# Patient Record
Sex: Female | Born: 2000 | Race: White | Hispanic: No | Marital: Single | Smoking: Never smoker
Health system: Southern US, Community
[De-identification: ages and names within clinical notes are randomized; demographics above are authoritative.]

## PROBLEM LIST (undated history)

## (undated) DIAGNOSIS — F419 Anxiety disorder, unspecified: Secondary | ICD-10-CM

## (undated) DIAGNOSIS — K219 Gastro-esophageal reflux disease without esophagitis: Secondary | ICD-10-CM

## (undated) DIAGNOSIS — F32A Depression, unspecified: Secondary | ICD-10-CM

---
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
## 2021-01-05 ENCOUNTER — Ambulatory Visit: Payer: BC Managed Care – PPO | Admitting: Family Medicine

## 2021-01-05 NOTE — Progress Notes (Deleted)
Established patient visit   Patient: Tracy Dominguez   DOB: 07-20-2000   20 y.o. Female  MRN: [id number] Visit Date: 01/05/2021  Today's healthcare provider: Wilhemena Durie, MD   No chief complaint on file.  Subjective    HPI  Anxiety, Follow-up  She was last seen for anxiety 6 months ago by Grace Bushy, PA.  Changes made at last visit include discontinuing hydroxyzine. Patient reports that it was ineffective and higher dose made to drowsy. Patient was started on clonazepam.   She reports {excellent/good/fair/poor:19665} compliance with treatment. She reports {good/fair/poor:18685} tolerance of treatment. She {is/is not:21021397} having side effects. {document side effects if present:1}  She feels her anxiety is {Desc; severity:60313} and {improved/worse/unchanged:3041574} since last visit.  Symptoms: {Yes/No:20286} chest pain {Yes/No:20286} difficulty concentrating  {Yes/No:20286} dizziness {Yes/No:20286} fatigue  {Yes/No:20286} feelings of losing control {Yes/No:20286} insomnia  {Yes/No:20286} irritable {Yes/No:20286} palpitations  {Yes/No:20286} panic attacks {Yes/No:20286} racing thoughts  {Yes/No:20286} shortness of breath {Yes/No:20286} sweating  {Yes/No:20286} tremors/shakes    GAD-7 Results GAD-7 Generalized Anxiety Disorder Screening Tool 08/15/2020 03/25/2020 10/08/2019  1. Feeling Nervous, Anxious, or on Edge 3 1 3   2. Not Being Able to Stop or Control Worrying 3 1 3   3. Worrying Too Much About Different Things 3 1 3   4. Trouble Relaxing 2 1 2   5. Being So Restless it's Hard To Sit Still 2 1 1   6. Becoming Easily Annoyed or Irritable 3 1 3   7. Feeling Afraid As If Something Awful Might Happen 3 0 2  Total GAD-7 Score 19 6 17   Difficulty At Work, Home, or Getting  Along With Others? Very difficult Somewhat difficult Very difficult    PHQ-9 Scores PHQ9 SCORE ONLY 08/15/2020 03/25/2020 10/08/2019  PHQ-9 Total Score 16 6 19      ---------------------------------------------------------------------------------------------------   {Show patient history (optional):23778}   Medications: Outpatient Medications Prior to Visit  Medication Sig  . amitriptyline (ELAVIL) 25 MG tablet TAKE 1 TABLET BY MOUTH EVERYDAY AT BEDTIME  . clonazePAM (KLONOPIN) 0.5 MG tablet TAKE 1/2 TO 1 TABLETS (0.25-0.5 MG TOTAL) BY MOUTH 2 (TWO) TIMES DAILY AS NEEDED FOR ANXIETY.  . Dexlansoprazole 30 MG capsule Take 1 capsule (30 mg total) by mouth daily.  Marland Kitchen etonogestrel (NEXPLANON) 68 MG IMPL implant 1 each by Subdermal route once.  . meloxicam (MOBIC) 7.5 MG tablet TAKE 1 TABLET (7.5 MG TOTAL) BY MOUTH DAILY. WITH FOOD  . ondansetron (ZOFRAN-ODT) 4 MG disintegrating tablet Take 4 mg by mouth every 8 (eight) hours as needed.  . pantoprazole (PROTONIX) 20 MG tablet Take 1 tablet (20 mg total) by mouth daily.   No facility-administered medications prior to visit.    Review of Systems  Constitutional:  Negative for activity change and fatigue.  Respiratory:  Negative for cough and shortness of breath.   Cardiovascular:  Negative for chest pain, palpitations and leg swelling.  Neurological:  Negative for dizziness, numbness and headaches.  Psychiatric/Behavioral:  Negative for agitation, self-injury, sleep disturbance and suicidal ideas. The patient is not nervous/anxious.    {Labs  Heme  Chem  Endocrine  Serology  Results Review (optional):23779}   Objective    There were no vitals taken for this visit. {Show previous vital signs (optional):23777}   Physical Exam  ***  No results found for any visits on 01/05/21.  Assessment & Plan     ***  No follow-ups on file.      {provider attestation***:1}   Wilhemena Durie, MD  [facility]  Family Practice [phone] (phone) [phone] (fax)  [place]

## 2021-02-02 ENCOUNTER — Other Ambulatory Visit: Payer: Self-pay | Admitting: Family Medicine

## 2021-02-02 DIAGNOSIS — F411 Generalized anxiety disorder: Secondary | ICD-10-CM

## 2021-02-02 NOTE — Telephone Encounter (Signed)
Requested medication (s) are due for refill today: yes   Requested medication (s) are on the active medication list: yes  Last refill:  01/06/2021  Future visit scheduled:no  Notes to clinic:  this refill cannot be delegated    Requested Prescriptions  Pending Prescriptions Disp Refills   clonazePAM (KLONOPIN) 0.5 MG tablet [Pharmacy Med Name: CLONAZEPAM 0.5 MG TABLET] 60 tablet 1    Sig: TAKE 1/2 TO 1 TABLETS (0.25-0.5 MG TOTAL) BY MOUTH 2 (TWO) TIMES DAILY AS NEEDED FOR ANXIETY.      Not Delegated - Psychiatry:  Anxiolytics/Hypnotics Failed - 02/02/2021 12:38 PM      Failed - This refill cannot be delegated      Passed - Urine Drug Screen completed in last 360 days      Passed - Valid encounter within last 6 months    Recent Outpatient Visits           1 month ago No-show for appointment   [place], MD   5 months ago Acute right-sided low back pain without sciatica   [facility] [place], Clearnce Sorrel, [place]   6 months ago No-show for appointment   [place], [place]   6 months ago Exposure to COVID-19 virus   [place], PA-C   9 months ago Need for meningitis vaccination   [place], Kelby Aline, FNP

## 2021-02-03 ENCOUNTER — Other Ambulatory Visit: Payer: Self-pay | Admitting: Family Medicine

## 2021-02-03 DIAGNOSIS — F411 Generalized anxiety disorder: Secondary | ICD-10-CM

## 2021-02-03 NOTE — Telephone Encounter (Signed)
   Notes to clinic:  Message has been sent to patient to contact office for appt  Refill cannot be delegated by NT  Looks like refused yesterday for appt    Requested Prescriptions  Pending Prescriptions Disp Refills   clonazePAM (KLONOPIN) 0.5 MG tablet [Pharmacy Med Name: CLONAZEPAM 0.5 MG TABLET] 60 tablet 1    Sig: TAKE 1/2 TO 1 TABLETS (0.25-0.5 MG TOTAL) BY MOUTH 2 (TWO) TIMES DAILY AS NEEDED FOR ANXIETY.      Not Delegated - Psychiatry:  Anxiolytics/Hypnotics Failed - 02/03/2021  8:58 AM      Failed - This refill cannot be delegated      Passed - Urine Drug Screen completed in last 360 days      Passed - Valid encounter within last 6 months    Recent Outpatient Visits           1 month ago No-show for appointment   [place], MD   5 months ago Acute right-sided low back pain without sciatica   [facility] [place], Clearnce Sorrel, [place]   6 months ago No-show for appointment   [place], [place]   6 months ago Exposure to COVID-19 virus   [facility], Clearnce Sorrel, PA-C   10 months ago Need for meningitis vaccination   [place], Kelby Aline, FNP

## 2021-03-06 ENCOUNTER — Emergency Department (EMERGENCY_DEPARTMENT_HOSPITAL)
Admission: EM | Admit: 2021-03-06 | Discharge: 2021-03-07 | Disposition: A | Discharge status: Still In Hospital | Payer: BC Managed Care – PPO | Source: Home / Self Care | Attending: Emergency Medicine | Admitting: Emergency Medicine

## 2021-03-06 DIAGNOSIS — F10920 Alcohol use, unspecified with intoxication, uncomplicated: Secondary | ICD-10-CM

## 2021-03-06 DIAGNOSIS — T50902A Poisoning by unspecified drugs, medicaments and biological substances, intentional self-harm, initial encounter: Secondary | ICD-10-CM

## 2021-03-06 DIAGNOSIS — F332 Major depressive disorder, recurrent severe without psychotic features: Secondary | ICD-10-CM

## 2021-03-07 ENCOUNTER — Inpatient Hospital Stay
Admission: RE | Admit: 2021-03-07 | Discharge: 2021-03-11 | DRG: 885 | Disposition: A | Discharge status: Home / Self Care | Payer: BC Managed Care – PPO | Source: Intra-hospital | Attending: Psychiatry | Admitting: Psychiatry

## 2021-03-07 ENCOUNTER — Emergency Department: Payer: BC Managed Care – PPO

## 2021-03-07 ENCOUNTER — Encounter: Payer: Self-pay | Admitting: Emergency Medicine

## 2021-03-07 ENCOUNTER — Other Ambulatory Visit: Payer: Self-pay

## 2021-03-07 ENCOUNTER — Encounter: Payer: Self-pay | Admitting: Psychiatry

## 2021-03-07 DIAGNOSIS — T39312A Poisoning by propionic acid derivatives, intentional self-harm, initial encounter: Secondary | ICD-10-CM | POA: Diagnosis present

## 2021-03-07 DIAGNOSIS — F1220 Cannabis dependence, uncomplicated: Secondary | ICD-10-CM | POA: Diagnosis present

## 2021-03-07 DIAGNOSIS — G8929 Other chronic pain: Secondary | ICD-10-CM | POA: Diagnosis present

## 2021-03-07 DIAGNOSIS — Z20822 Contact with and (suspected) exposure to covid-19: Secondary | ICD-10-CM | POA: Diagnosis present

## 2021-03-07 DIAGNOSIS — Y907 Blood alcohol level of 200-239 mg/100 ml: Secondary | ICD-10-CM | POA: Diagnosis present

## 2021-03-07 DIAGNOSIS — M549 Dorsalgia, unspecified: Secondary | ICD-10-CM | POA: Diagnosis present

## 2021-03-07 DIAGNOSIS — F10220 Alcohol dependence with intoxication, uncomplicated: Secondary | ICD-10-CM | POA: Diagnosis present

## 2021-03-07 DIAGNOSIS — Z79899 Other long term (current) drug therapy: Secondary | ICD-10-CM

## 2021-03-07 DIAGNOSIS — F332 Major depressive disorder, recurrent severe without psychotic features: Secondary | ICD-10-CM

## 2021-03-07 DIAGNOSIS — F411 Generalized anxiety disorder: Secondary | ICD-10-CM | POA: Diagnosis present

## 2021-03-07 DIAGNOSIS — M62838 Other muscle spasm: Secondary | ICD-10-CM | POA: Diagnosis present

## 2021-03-07 DIAGNOSIS — Z818 Family history of other mental and behavioral disorders: Secondary | ICD-10-CM | POA: Diagnosis not present

## 2021-03-07 DIAGNOSIS — F1020 Alcohol dependence, uncomplicated: Secondary | ICD-10-CM | POA: Diagnosis present

## 2021-03-07 DIAGNOSIS — K219 Gastro-esophageal reflux disease without esophagitis: Secondary | ICD-10-CM | POA: Diagnosis present

## 2021-03-07 LAB — SALICYLATE LEVEL
Salicylate Lvl: <7 mg/dL — ABNORMAL LOW (ref 7.0–30.0)
Salicylate Lvl: <7 mg/dL — ABNORMAL LOW (ref 7.0–30.0)

## 2021-03-07 LAB — RESP PANEL BY RT-PCR (FLU A&B, COVID) ARPGX2
Influenza A by PCR: NEGATIVE
Influenza B by PCR: NEGATIVE
SARS Coronavirus 2 by RT PCR: NEGATIVE

## 2021-03-07 LAB — COMPREHENSIVE METABOLIC PANEL
ALT: 16 U/L (ref 0–44)
AST: 26 U/L (ref 15–41)
Albumin: 4.6 g/dL (ref 3.5–5.0)
Alkaline Phosphatase: 61 U/L (ref 38–126)
Anion gap: 6 (ref 5–15)
BUN: <5 mg/dL — ABNORMAL LOW (ref 6–20)
CO2: 29 mmol/L (ref 22–32)
Calcium: 8.5 mg/dL — ABNORMAL LOW (ref 8.9–10.3)
Chloride: 108 mmol/L (ref 98–111)
Creatinine, Ser: 0.64 mg/dL (ref 0.44–1.00)
GFR, Estimated: >60 mL/min (ref >60)
Glucose, Bld: 86 mg/dL (ref 70–99)
Potassium: 3.6 mmol/L (ref 3.5–5.1)
Sodium: 143 mmol/L (ref 135–145)
Total Bilirubin: 0.4 mg/dL (ref 0.3–1.2)
Total Protein: 7.2 g/dL (ref 6.5–8.1)

## 2021-03-07 LAB — URINE DRUG SCREEN, QUALITATIVE (ARMC ONLY)
Amphetamines, Ur Screen: NOT DETECTED
Barbiturates, Ur Screen: NOT DETECTED
Benzodiazepine, Ur Scrn: NOT DETECTED
Cannabinoid 50 Ng, Ur ~~LOC~~: POSITIVE — AB
Cocaine Metabolite,Ur ~~LOC~~: NOT DETECTED
MDMA (Ecstasy)Ur Screen: NOT DETECTED
Methadone Scn, Ur: NOT DETECTED
Opiate, Ur Screen: NOT DETECTED
Phencyclidine (PCP) Ur S: NOT DETECTED
Tricyclic, Ur Screen: NOT DETECTED

## 2021-03-07 LAB — CBC WITH DIFFERENTIAL/PLATELET
Abs Immature Granulocytes: 0.02 10*3/uL (ref 0.00–0.07)
Basophils Absolute: 0 10*3/uL (ref 0.0–0.1)
Basophils Relative: 0 %
Eosinophils Absolute: 0.1 10*3/uL (ref 0.0–0.5)
Eosinophils Relative: 1 %
HCT: 44.2 % (ref 36.0–46.0)
Hemoglobin: 15.1 g/dL — ABNORMAL HIGH (ref 12.0–15.0)
Immature Granulocytes: 0 %
Lymphocytes Relative: 55 %
Lymphs Abs: 2.7 10*3/uL (ref 0.7–4.0)
MCH: 33.3 pg (ref 26.0–34.0)
MCHC: 34.2 g/dL (ref 30.0–36.0)
MCV: 97.6 fL (ref 80.0–100.0)
Monocytes Absolute: 0.3 10*3/uL (ref 0.1–1.0)
Monocytes Relative: 7 %
Neutro Abs: 1.8 10*3/uL (ref 1.7–7.7)
Neutrophils Relative %: 37 %
Platelets: 252 10*3/uL (ref 150–400)
RBC: 4.53 MIL/uL (ref 3.87–5.11)
RDW: 12.3 % (ref 11.5–15.5)
WBC: 4.9 10*3/uL (ref 4.0–10.5)
nRBC: 0 % (ref 0.0–0.2)

## 2021-03-07 LAB — URINALYSIS, COMPLETE (UACMP) WITH MICROSCOPIC
Bilirubin Urine: NEGATIVE
Glucose, UA: NEGATIVE mg/dL
Ketones, ur: NEGATIVE mg/dL
Leukocytes,Ua: NEGATIVE
Nitrite: NEGATIVE
Protein, ur: NEGATIVE mg/dL
Specific Gravity, Urine: 1.013 (ref 1.005–1.030)
pH: 7 (ref 5.0–8.0)

## 2021-03-07 LAB — ETHANOL: Alcohol, Ethyl (B): 239 mg/dL — ABNORMAL HIGH (ref <10)

## 2021-03-07 LAB — ACETAMINOPHEN LEVEL
Acetaminophen (Tylenol), Serum: <10 ug/mL — ABNORMAL LOW (ref 10–30)
Acetaminophen (Tylenol), Serum: <10 ug/mL — ABNORMAL LOW (ref 10–30)

## 2021-03-07 LAB — POC URINE PREG, ED
Preg Test, Ur: NEGATIVE
Preg Test, Ur: NEGATIVE

## 2021-03-07 LAB — LIPASE, BLOOD: Lipase: 23 U/L (ref 11–51)

## 2021-03-07 LAB — TROPONIN I (HIGH SENSITIVITY)
Troponin I (High Sensitivity): <2 ng/L (ref <18)
Troponin I (High Sensitivity): <2 ng/L (ref <18)

## 2021-03-07 MED ORDER — CLONAZEPAM 0.5 MG PO TABS
0.5000 mg | ORAL_TABLET | Freq: Every day | ORAL | Status: DC | PRN
  Administered 2021-03-07 – 2021-03-08 (×3): 0.5 mg via ORAL
  Filled 2021-03-07 (×3): qty 1

## 2021-03-07 MED ORDER — ACETAMINOPHEN 325 MG PO TABS
650.0000 mg | ORAL_TABLET | Freq: Four times a day (QID) | ORAL | Status: DC | PRN
  Administered 2021-03-07: 650 mg via ORAL
  Filled 2021-03-07: qty 2

## 2021-03-07 MED ORDER — MAGNESIUM HYDROXIDE 400 MG/5ML PO SUSP: 30.0000 mL | Freq: Every day | ORAL | Status: DC | PRN

## 2021-03-07 MED ORDER — TRAZODONE HCL 50 MG PO TABS
50.0000 mg | ORAL_TABLET | Freq: Every evening | ORAL | Status: DC | PRN
  Administered 2021-03-08 – 2021-03-10 (×3): 50 mg via ORAL
  Filled 2021-03-07 (×3): qty 1

## 2021-03-07 MED ORDER — FAMOTIDINE IN NACL 20-0.9 MG/50ML-% IV SOLN
20.0000 mg | Freq: Once | INTRAVENOUS | Status: AC
  Administered 2021-03-07: 20 mg via INTRAVENOUS
  Filled 2021-03-07: qty 50

## 2021-03-07 MED ORDER — PANTOPRAZOLE SODIUM 40 MG PO TBEC
40.0000 mg | DELAYED_RELEASE_TABLET | Freq: Every day | ORAL | Status: DC
  Administered 2021-03-08 – 2021-03-11 (×4): 40 mg via ORAL
  Filled 2021-03-07 (×4): qty 1

## 2021-03-07 MED ORDER — PANTOPRAZOLE SODIUM 40 MG PO TBEC
40.0000 mg | DELAYED_RELEASE_TABLET | Freq: Every day | ORAL | Status: DC
  Filled 2021-03-07: qty 1

## 2021-03-07 MED ORDER — TRAZODONE HCL 50 MG PO TABS
50.0000 mg | ORAL_TABLET | Freq: Every day | ORAL | Status: DC
  Administered 2021-03-07: 50 mg via ORAL
  Filled 2021-03-07: qty 1

## 2021-03-07 MED ORDER — CLONAZEPAM 0.5 MG PO TABS: 0.5000 mg | ORAL_TABLET | Freq: Two times a day (BID) | ORAL | Status: DC | PRN

## 2021-03-07 MED ORDER — SODIUM CHLORIDE 0.9 % IV BOLUS (SEPSIS)
1000.0000 mL | Freq: Once | INTRAVENOUS | Status: AC
  Administered 2021-03-07: 1000 mL via INTRAVENOUS

## 2021-03-07 MED ORDER — SODIUM CHLORIDE 0.9 % IV BOLUS: 1000.0000 mL | Freq: Once | INTRAVENOUS | Status: DC

## 2021-03-07 MED ORDER — ONDANSETRON HCL 4 MG/2ML IJ SOLN
4.0000 mg | Freq: Once | INTRAMUSCULAR | Status: AC
  Administered 2021-03-07: 4 mg via INTRAVENOUS
  Filled 2021-03-07: qty 2

## 2021-03-07 MED ORDER — CHARCOAL ACTIVATED PO LIQD
50.0000 g | Freq: Once | ORAL | Status: AC
  Administered 2021-03-07: 50 g via ORAL
  Filled 2021-03-07: qty 240

## 2021-03-07 MED ORDER — ALUM & MAG HYDROXIDE-SIMETH 200-200-20 MG/5ML PO SUSP: 30.0000 mL | ORAL | Status: DC | PRN

## 2021-03-07 MED ORDER — CLONAZEPAM 0.5 MG PO TABS
0.5000 mg | ORAL_TABLET | Freq: Every day | ORAL | Status: DC | PRN
  Administered 2021-03-07: 0.5 mg via ORAL
  Filled 2021-03-07: qty 1

## 2021-03-07 MED ORDER — AMITRIPTYLINE HCL 50 MG PO TABS: 25.0000 mg | ORAL_TABLET | Freq: Every day | ORAL | Status: DC

## 2021-03-07 NOTE — BH Assessment (Signed)
Comprehensive Clinical Assessment (CCA) Note  03/07/2021 Tracy Dominguez [id number]  Chief Complaint:  Chief Complaint  Patient presents with   Drug Overdose   Visit Diagnosis: Major Depression   Tracy Dominguez is an 20 year old female who presents to the ER due to taking an intentional overdose of medications. She states she was drinking alcohol and started to feel sad. It resulted in her ingesting the medications. Upon arrival to the ER her BAC was 239. She denies SI but when discussing what happened, she has poor insight into what done and minimized the actions.  During the interview the patient was calm, cooperative and pleasant. She was able to provide appropriate answers to the questions. She denies SI/HI and AV/H.  CCA Screening, Triage and Referral (STR)  Patient Reported Information How did you hear about [place]? Self  What Is the Reason for Your Visit/Call Today? Patient took an intentional overdose of medications to end her life.  How Long Has This Been Causing You Problems? <Week  What Do You Feel Would Help You the Most Today? Treatment for Depression or other mood problem   Have You Recently Had Any Thoughts About Hurting Yourself? Yes  Are You Planning to Commit Suicide/Harm Yourself At This time? Yes   Have you Recently Had Thoughts About Hurting Someone [place] Dawn? No  Are You Planning to Harm Someone at This Time? No  Explanation: No data recorded  Have You Used Any Alcohol or Drugs in the Past 24 Hours? Yes  How Long Ago Did You Use Drugs or Alcohol? No data recorded What Did You Use and How Much? Alcohol and cannabis   Do You Currently Have a Therapist/Psychiatrist? No  Name of Therapist/Psychiatrist: No data recorded  Have You Been Recently Discharged From Any Office Practice or Programs? No  Explanation of Discharge From Practice/Program: No data recorded    CCA Screening Triage Referral Assessment Type of Contact: Face-to-Face  Telemedicine Service  Delivery:   Is this Initial or Reassessment? No data recorded Date Telepsych consult ordered in [facility]:  No data recorded Time Telepsych consult ordered in [facility]:  No data recorded Location of Assessment: [facility] ED  Provider Location: [facility] ED   Collateral Involvement: No data recorded  Does Patient Have a [place]? No data recorded Name and Contact of Legal Guardian: No data recorded If Minor and Not Living with Parent(s), Who has Custody? No data recorded Is CPS involved or ever been involved? Never  Is APS involved or ever been involved? Never   Patient Determined To Be At Risk for Harm To Self or Others Based on Review of Patient Reported Information or Presenting Complaint? Yes, for Self-Harm  Method: No data recorded Availability of Means: No data recorded Intent: No data recorded Notification Required: No data recorded Additional Information for Danger to Others Potential: No data recorded Additional Comments for Danger to Others Potential: No data recorded Are There Guns or Other Weapons in Your Home? No data recorded Types of Guns/Weapons: No data recorded Are These Weapons Safely Secured?                            No data recorded Who Could Verify You Are Able To Have These Secured: No data recorded Do You Have any Outstanding Charges, Pending Court Dates, Parole/Probation? No data recorded Contacted To Inform of Risk of Harm To Self or Others: No data recorded   Does Patient Present under Involuntary Commitment? Yes  IVC Papers Initial File Date: 03/07/21   [place] of Residence: [place]   Patient Currently Receiving the Following Services: Not Receiving Services   Determination of Need: Emergent (2 hours)   Options For Referral: Inpatient Hospitalization     CCA Biopsychosocial Patient Reported Schizophrenia/Schizoaffective Diagnosis in Past: No   Strengths: Have a support system, some insight into her problems and wanting  help.   Mental Health Symptoms Depression:   Hopelessness; Worthlessness   Duration of Depressive symptoms:  Duration of Depressive Symptoms: Less than two weeks   Mania:   None   Anxiety:    None   Psychosis:   None   Duration of Psychotic symptoms:    Trauma:   None   Obsessions:   None   Compulsions:   None   Inattention:   None   Hyperactivity/Impulsivity:   None   Oppositional/Defiant Behaviors:   None   Emotional Irregularity:   N/A   Other Mood/Personality Symptoms:  No data recorded   Mental Status Exam Appearance and self-care  Stature:   Average   Weight:   Average weight   Clothing:   Age-appropriate; Neat/clean   Grooming:   Normal   Cosmetic use:   None   Posture/gait:   Normal   Motor activity:   -- (Within normal range)   Sensorium  Attention:   Normal   Concentration:   Normal   Orientation:   X5   Recall/memory:   Normal   Affect and Mood  Affect:   Appropriate; Full Range   Mood:   Anxious; Depressed   Relating  Eye contact:   Normal   Facial expression:   Depressed; Anxious   Attitude toward examiner:   Cooperative   Thought and Language  Speech flow:  Clear and Coherent; Normal   Thought content:   Appropriate to Mood and Circumstances   Preoccupation:   None   Hallucinations:   None   Organization:  No data recorded  [employer] of Knowledge:   Fair   Intelligence:   Average   Abstraction:   Functional   Judgement:   Impaired   Reality Testing:   Adequate   Insight:   Poor   Decision Making:   Impulsive   Social Functioning  Social Maturity:   Impulsive   Social Judgement:   Normal   Stress  Stressors:   Other (Comment)   Coping Ability:   Overwhelmed   Skill Deficits:   None   Supports:   Family; Friends/Service system     Religion: Religion/Spirituality Are You A Religious Person?: No  Leisure/Recreation: Leisure /  Recreation Do You Have Hobbies?: No  Exercise/Diet: Exercise/Diet Do You Exercise?: No Have You Gained or Lost A Significant Amount of Weight in the Past Six Months?: No Do You Follow a Special Diet?: No Do You Have Any Trouble Sleeping?: No   CCA Employment/Education Employment/Work Situation: Employment / Work Situation Has Patient ever Been in Passenger transport manager?: No  Education: Education Last Grade Completed: 12 Did You Nutritional therapist?: No Did You Have An Individualized Education Program (IIEP): No Did You Have Any Difficulty At [employer]?: No Patient's Education Has Been Impacted by Current Illness: No   CCA Family/Childhood History Family and Relationship History: Family history Marital status: Long term relationship Does patient have children?: No  Childhood History:  Childhood History By whom was/is the patient raised?: Both parents, Mother Did patient suffer any verbal/emotional/physical/sexual abuse as a child?: No Did patient  suffer from severe childhood neglect?: No Has patient ever been sexually abused/assaulted/raped as an adolescent or adult?: No Was the patient ever a victim of a crime or a disaster?: No Witnessed domestic violence?: No Has patient been affected by domestic violence as an adult?: No  Child/Adolescent Assessment:     CCA Substance Use Alcohol/Drug Use: Alcohol / Drug Use Pain Medications: See PTA Prescriptions: See PTA Over the Counter: See PTA History of alcohol / drug use?: Yes Longest period of sobriety (when/how long): Unable to quantify Substance #1 Name of Substance 1: Alcohol 1 - Last Use / Amount: 03/06/2021 Substance #2 Name of Substance 2: Cananbis 2 - Last Use / Amount: Unknown   ASAM's:  Six Dimensions of Multidimensional Assessment  Dimension 1:  Acute Intoxication and/or Withdrawal Potential:      Dimension 2:  Biomedical Conditions and Complications:      Dimension 3:  Emotional, Behavioral, or Cognitive  Conditions and Complications:     Dimension 4:  Readiness to Change:     Dimension 5:  Relapse, Continued use, or Continued Problem Potential:     Dimension 6:  Recovery/Living Environment:     ASAM Severity Score:    ASAM Recommended Level of Treatment:     Substance use Disorder (SUD)    Recommendations for Services/Supports/Treatments:    Discharge Disposition:    DSM5 Diagnoses: Patient Active Problem List   Diagnosis Date Noted   Major depressive disorder, recurrent severe without psychotic features ([place]) 03/07/2021   Difficulty sleeping 03/25/2020   Gastroesophageal reflux disease without esophagitis 03/25/2020    Referrals to Alternative Service(s): Referred to Alternative Service(s):   Place:   Date:   Time:    Referred to Alternative Service(s):   Place:   Date:   Time:    Referred to Alternative Service(s):   Place:   Date:   Time:    Referred to Alternative Service(s):   Place:   Date:   Time:     Gunnar Fusi MS, LCAS, [facility], [facility] Therapeutic Triage Specialist 03/07/2021 1:41 PM

## 2021-03-07 NOTE — Consult Note (Signed)
[facility]iatry Consult   Reason for Consult:  intentional overdose Referring Physician:  EDP Patient Identification: Tracy Dominguez MRN:  [id number] Principal Diagnosis: Major depressive disorder, recurrent severe without psychotic features ([place]) Diagnosis:  Principal Problem:   Major depressive disorder, recurrent severe without psychotic features ([place])   Total Time spent with patient: 1 hour  Subjective:   Tracy Dominguez is a 20 y.o. female patient admitted with intentional overdose.  HPI:  20 yo female with an overdose of 30-35 ibuprofen yesterday after drinking 3 drinks.  She denies this was a suicide attempt, yet acknowledges she "took too much ibuprofen.  I got drunk and really, really sad."  Minimizes her symptoms, appears depressed with decreased activity.  Denies prior attempts, history of anxiety and sleep issues.  Brother with bipolar disorder and mother with depression and anxiety.  She graduated from high school this spring.  Past Psychiatric History: depression, anxiety  Risk to Self:  yes Risk to Others:  none Prior Inpatient Therapy:  denies Prior Outpatient Therapy:  none  Past Medical History:  Past Medical History:  Diagnosis Date   Anxiety    Depression    GERD (gastroesophageal reflux disease)    History reviewed. No pertinent surgical history. Family History:  Family History  Problem Relation Age of Onset   Depression Mother    Anxiety disorder Mother    Obesity Mother    Bipolar disorder Brother    Liver disease Maternal Grandmother    Kidney disease Maternal Grandmother    Breast cancer Maternal Grandmother    Kidney disease Paternal Grandmother    Hypertension Paternal Biochemist, clinical    Family Psychiatric  History: see above Social History:  Social History   Substance and Sexual Activity  Alcohol Use Yes   Comment: occ     Social History   Substance and Sexual Activity  Drug Use Never    Social History    Socioeconomic History   Marital status: Single    Spouse name: Not on file   Number of children: Not on file   Years of education: Not on file   Highest education level: Not on file  Occupational History   Occupation: student  Tobacco Use   Smoking status: Never   Smokeless tobacco: Never  Vaping Use   Vaping Use: Every day  Substance and Sexual Activity   Alcohol use: Yes    Comment: occ   Drug use: Never   Sexual activity: Yes    Birth control/protection: Implant  Other Topics Concern   Not on file  Social History Narrative   Not on file   Social Determinants of Health   Financial Resource Strain: Not on file  Food Insecurity: Not on file  Transportation Needs: Not on file  Physical Activity: Not on file  Stress: Not on file  Social Connections: Not on file   Additional Social History:    Allergies:  No Known Allergies  Labs:  Results for orders placed or performed during the hospital encounter of 03/06/21 (from the past 48 hour(s))  CBC with Differential     Status: Abnormal   Collection Time: 03/07/21 12:09 AM  Result Value Ref Range   WBC 4.9 4.0 - 10.5 K/uL   RBC 4.53 3.87 - 5.11 MIL/uL   Hemoglobin 15.1 (H) 12.0 - 15.0 g/dL   HCT 44.2 36.0 - 46.0 %   MCV 97.6 80.0 - 100.0 fL   MCH 33.3 26.0 - 34.0 pg  MCHC 34.2 30.0 - 36.0 g/dL   RDW 12.3 11.5 - 15.5 %   Platelets 252 150 - 400 K/uL   nRBC 0.0 0.0 - 0.2 %   Neutrophils Relative % 37 %   Neutro Abs 1.8 1.7 - 7.7 K/uL   Lymphocytes Relative 55 %   Lymphs Abs 2.7 0.7 - 4.0 K/uL   Monocytes Relative 7 %   Monocytes Absolute 0.3 0.1 - 1.0 K/uL   Eosinophils Relative 1 %   Eosinophils Absolute 0.1 0.0 - 0.5 K/uL   Basophils Relative 0 %   Basophils Absolute 0.0 0.0 - 0.1 K/uL   Immature Granulocytes 0 %   Abs Immature Granulocytes 0.02 0.00 - 0.07 K/uL    Comment: Performed at [facility], [address] [place], [place] [address]  Comprehensive metabolic panel     Status: Abnormal    Collection Time: 03/07/21 12:09 AM  Result Value Ref Range   Sodium 143 135 - 145 mmol/L   Potassium 3.6 3.5 - 5.1 mmol/L   Chloride 108 98 - 111 mmol/L   CO2 29 22 - 32 mmol/L   Glucose, Bld 86 70 - 99 mg/dL    Comment: Glucose reference range applies only to samples taken after fasting for at least 8 hours.   BUN <5 (L) 6 - 20 mg/dL   Creatinine, Ser 0.64 0.44 - 1.00 mg/dL   Calcium 8.5 (L) 8.9 - 10.3 mg/dL   Total Protein 7.2 6.5 - 8.1 g/dL   Albumin 4.6 3.5 - 5.0 g/dL   AST 26 15 - 41 U/L   ALT 16 0 - 44 U/L   Alkaline Phosphatase 61 38 - 126 U/L   Total Bilirubin 0.4 0.3 - 1.2 mg/dL   GFR, Estimated >60 >60 mL/min    Comment: (NOTE) Calculated using the CKD-EPI Creatinine Equation (2021)    Anion gap 6 5 - 15    Comment: Performed at [facility] [address] [place], [place] [address]  Ethanol     Status: Abnormal   Collection Time: 03/07/21 12:09 AM  Result Value Ref Range   Alcohol, Ethyl (B) 239 (H) <10 mg/dL    Comment: (NOTE) Lowest detectable limit for serum alcohol is 10 mg/dL.  For medical purposes only. Performed at [facility], [place], [place] [address]   Acetaminophen level     Status: Abnormal   Collection Time: 03/07/21 12:09 AM  Result Value Ref Range   Acetaminophen (Tylenol), Serum <10 (L) 10 - 30 ug/mL    Comment: (NOTE) Therapeutic concentrations vary significantly. A range of 10-30 ug/mL  may be an effective concentration for many patients. However, some  are best treated at concentrations outside of this range. Acetaminophen concentrations >150 ug/mL at 4 hours after ingestion  and >50 ug/mL at 12 hours after ingestion are often associated with  toxic reactions.  Performed at [facility], [address] [place], [place] [id number]   Salicylate level     Status: Abnormal   Collection Time: 03/07/21 12:09 AM  Result Value Ref Range   Salicylate Lvl Q000111Q (L) 7.0 - 30.0 mg/dL    Comment:  Performed at [facility], [address] [place], [place] [address]  Lipase, blood     Status: None   Collection Time: 03/07/21 12:09 AM  Result Value Ref Range   Lipase 23 11 - 51 U/L    Comment: Performed at [facility], [address],  [place], [place] [address]  Troponin I (High Sensitivity)     Status: None   Collection Time: 03/07/21 12:09 AM  Result Value Ref Range   Troponin I (High Sensitivity) <2 <18 ng/L    Comment: (NOTE) Elevated high sensitivity troponin I (hsTnI) values and significant  changes across serial measurements may suggest ACS but many other  chronic and acute conditions are known to elevate hsTnI results.  Refer to the "Links" section for chest pain algorithms and additional  guidance. Performed at [facility], [address] [place], [place] [address]   Urinalysis, Complete w Microscopic     Status: Abnormal   Collection Time: 03/07/21 12:09 AM  Result Value Ref Range   Color, Urine YELLOW (A) YELLOW   APPearance CLEAR (A) CLEAR   Specific Gravity, Urine 1.013 1.005 - 1.030   pH 7.0 5.0 - 8.0   Glucose, UA NEGATIVE NEGATIVE mg/dL   Hgb urine dipstick LARGE (A) NEGATIVE   Bilirubin Urine NEGATIVE NEGATIVE   Ketones, ur NEGATIVE NEGATIVE mg/dL   Protein, ur NEGATIVE NEGATIVE mg/dL   Nitrite NEGATIVE NEGATIVE   Leukocytes,Ua NEGATIVE NEGATIVE   RBC / HPF 0-5 0 - 5 RBC/hpf   WBC, UA 0-5 0 - 5 WBC/hpf   Bacteria, UA RARE (A) NONE SEEN   Squamous Epithelial / LPF 0-5 0 - 5   Mucus PRESENT     Comment: Performed at [facility], [address], [place], [place] [address]  Urine Drug Screen, Qualitative     Status: Abnormal   Collection Time: 03/07/21 12:09 AM  Result Value Ref Range   Tricyclic, Ur Screen NONE DETECTED NONE DETECTED   Amphetamines, Ur Screen NONE DETECTED NONE DETECTED   MDMA (Ecstasy)Ur Screen NONE DETECTED NONE DETECTED   Cocaine Metabolite,Ur [place] NONE DETECTED NONE DETECTED   Opiate, Ur  Screen NONE DETECTED NONE DETECTED   Phencyclidine (PCP) Ur S NONE DETECTED NONE DETECTED   Cannabinoid 50 Ng, Ur [place] POSITIVE (A) NONE DETECTED   Barbiturates, Ur Screen NONE DETECTED NONE DETECTED   Benzodiazepine, Ur Scrn NONE DETECTED NONE DETECTED   Methadone Scn, Ur NONE DETECTED NONE DETECTED    Comment: (NOTE) Tricyclics + metabolites, urine    Cutoff 1000 ng/mL Amphetamines + metabolites, urine  Cutoff 1000 ng/mL MDMA (Ecstasy), urine              Cutoff 500 ng/mL Cocaine Metabolite, urine          Cutoff 300 ng/mL Opiate + metabolites, urine        Cutoff 300 ng/mL Phencyclidine (PCP), urine         Cutoff 25 ng/mL Cannabinoid, urine                 Cutoff 50 ng/mL Barbiturates + metabolites, urine  Cutoff 200 ng/mL Benzodiazepine, urine              Cutoff 200 ng/mL Methadone, urine                   Cutoff 300 ng/mL  The urine drug screen provides only a preliminary, unconfirmed analytical test result and should not be used for non-medical purposes. Clinical consideration and professional judgment should be applied to any positive drug screen result due to possible interfering substances. A more specific alternate chemical method must be used in order to obtain a confirmed analytical result. Gas chromatography / mass spectrometry (GC/MS) is the preferred confirm atory method. Performed at [facility], [phone] [place], [place] [address]   POC urine preg, ED     Status: None   Collection Time: 03/07/21 12:15 AM  Result Value Ref Range   Preg Test, Ur NEGATIVE NEGATIVE    Comment:        THE SENSITIVITY OF THIS METHODOLOGY IS >24 mIU/mL   Troponin I (High Sensitivity)     Status: None   Collection Time: 03/07/21  2:33 AM  Result Value Ref Range   Troponin I (High Sensitivity) <2 <18 ng/L    Comment: (NOTE) Elevated high sensitivity troponin I (hsTnI) values and significant  changes across serial measurements may suggest ACS but many other   chronic and acute conditions are known to elevate hsTnI results.  Refer to the "Links" section for chest pain algorithms and additional  guidance. Performed at [facility], [address] [place], [place] [address]   Acetaminophen level     Status: Abnormal   Collection Time: 03/07/21  4:29 AM  Result Value Ref Range   Acetaminophen (Tylenol), Serum <10 (L) 10 - 30 ug/mL    Comment: (NOTE) Therapeutic concentrations vary significantly. A range of 10-30 ug/mL  may be an effective concentration for many patients. However, some  are best treated at concentrations outside of this range. Acetaminophen concentrations >150 ug/mL at 4 hours after ingestion  and >50 ug/mL at 12 hours after ingestion are often associated with  toxic reactions.  Performed at [facility], [address] [place], [place] [id number]   Salicylate level     Status: Abnormal   Collection Time: 03/07/21  4:29 AM  Result Value Ref Range   Salicylate Lvl Q000111Q (L) 7.0 - 30.0 mg/dL    Comment: Performed at [facility], [address] [place], [place] [address]  POC urine preg, ED     Status: None   Collection Time: 03/07/21  5:47 AM  Result Value Ref Range   Preg Test, Ur Negative Negative    Current Facility-Administered Medications  Medication Dose Route Frequency Provider Last Rate Last Admin   clonazePAM (KLONOPIN) tablet 0.5 mg  0.5 mg Oral Daily PRN Patrecia Pour, NP       pantoprazole (PROTONIX) EC tablet 40 mg  40 mg Oral Daily Ward, Kristen N, DO       Current Outpatient Medications  Medication Sig Dispense Refill   amitriptyline (ELAVIL) 25 MG tablet TAKE 1 TABLET BY MOUTH EVERYDAY AT BEDTIME 90 tablet 1   clonazePAM (KLONOPIN) 0.5 MG tablet TAKE 1/2 TO 1 TABLETS (0.25-0.5 MG TOTAL) BY MOUTH 2 (TWO) TIMES DAILY AS NEEDED FOR ANXIETY. 60 tablet 0   Dexlansoprazole 30 MG capsule Take 1 capsule (30 mg total) by mouth daily. 30 capsule 3   etonogestrel (NEXPLANON) 68  MG IMPL implant 1 each by Subdermal route once.     meloxicam (MOBIC) 7.5 MG tablet TAKE 1 TABLET (7.5 MG TOTAL) BY MOUTH DAILY. WITH FOOD 30 tablet 0   ondansetron (ZOFRAN-ODT) 4 MG disintegrating tablet Take 4 mg by mouth every 8 (eight) hours as needed.     pantoprazole (PROTONIX) 20 MG tablet Take 1 tablet (20 mg total) by mouth daily. 30 tablet 1    Musculoskeletal: Strength & Muscle Tone: within normal limits Gait & Station: normal Patient leans: N/A  Psychiatric Specialty Exam: Physical Exam Vitals and nursing note reviewed.  Constitutional:      Appearance: Normal appearance.  HENT:     Head: Normocephalic.  Nose: Nose normal.  Pulmonary:     Effort: Pulmonary effort is normal.  Musculoskeletal:        General: Normal range of motion.     Cervical back: Normal range of motion.  Neurological:     General: No focal deficit present.     Mental Status: She is alert and oriented to person, place, and time.  Psychiatric:        Attention and Perception: Attention and perception normal.        Mood and Affect: Mood is anxious and depressed.        Speech: Speech normal.        Behavior: Behavior normal. Behavior is cooperative.        Thought Content: Thought content includes suicidal ideation. Thought content includes suicidal plan.        Cognition and Memory: Cognition and memory normal.        Judgment: Judgment is impulsive.    Review of Systems  Psychiatric/Behavioral:  Positive for depression and suicidal ideas. The patient is nervous/anxious.   All other systems reviewed and are negative.  Blood pressure 103/71, pulse 78, temperature 97.9 F (36.6 C), temperature source Oral, resp. rate 12, height '5\' 4"'$  (1.626 m), weight 58.1 kg, SpO2 98 %.Body mass index is 21.97 kg/m.  General Appearance: Disheveled  Eye Contact:  Fair  Speech:  Normal Rate  Volume:  Decreased  Mood:  Anxious and Depressed  Affect:  Congruent  Thought Process:  Coherent and Descriptions  of Associations: Intact  Orientation:  Full (Time, Place, and Person)  Thought Content:  WDL and Logical  Suicidal Thoughts:  Yes.  with intent/plan  Homicidal Thoughts:  No  Memory:  Immediate;   Fair Recent;   Fair Remote;   Fair  Judgement:  Poor  Insight:  Lacking  Psychomotor Activity:  Decreased  Concentration:  Concentration: Fair and Attention Span: Fair  Recall:  [employer] of Knowledge:  Fair  Language:  Good  Akathisia:  No  Handed:  Right  AIMS (if indicated):     Assets:  Housing Leisure Time Physical Health Resilience Social Support  ADL's:  Intact  Cognition:  WNL  Sleep:        Physical Exam: Physical Exam Vitals and nursing note reviewed.  Constitutional:      Appearance: Normal appearance.  HENT:     Head: Normocephalic.     Nose: Nose normal.  Pulmonary:     Effort: Pulmonary effort is normal.  Musculoskeletal:        General: Normal range of motion.     Cervical back: Normal range of motion.  Neurological:     General: No focal deficit present.     Mental Status: She is alert and oriented to person, place, and time.  Psychiatric:        Attention and Perception: Attention and perception normal.        Mood and Affect: Mood is anxious and depressed.        Speech: Speech normal.        Behavior: Behavior normal. Behavior is cooperative.        Thought Content: Thought content includes suicidal ideation. Thought content includes suicidal plan.        Cognition and Memory: Cognition and memory normal.        Judgment: Judgment is impulsive.   Review of Systems  Psychiatric/Behavioral:  Positive for depression and suicidal ideas. The patient is nervous/anxious.   All  other systems reviewed and are negative. Blood pressure 103/71, pulse 78, temperature 97.9 F (36.6 C), temperature source Oral, resp. rate 12, height '5\' 4"'$  (1.626 m), weight 58.1 kg, SpO2 98 %. Body mass index is 21.97 kg/m.  Treatment Plan Summary: Daily contact with patient  to assess and evaluate symptoms and progress in treatment, Medication management, and Plan : Major depressive disorder, recurrent, severe without psychosis: -Admit to inpatient psychiatric facility for stabilization  Anxiety: -Decreased Klonopin 0.5 mg BID PRN to daily PRN  Disposition: Recommend psychiatric Inpatient admission when medically cleared.  Waylan Boga, NP 03/07/2021 12:28 PM

## 2021-03-07 NOTE — ED Notes (Signed)
IVC/pending psych consult 

## 2021-03-07 NOTE — ED Notes (Signed)
Mom at bedside.

## 2021-03-07 NOTE — ED Notes (Signed)
Psych NP and TTS at bedside 

## 2021-03-07 NOTE — ED Provider Notes (Signed)
Emergency Medicine Observation Re-evaluation Note  Tracy Dominguez is a 20 y.o. female, seen on rounds today.  Pt initially presented to the ED for complaints of Drug Overdose Currently, the patient is resting comfortably without complaints.  Physical Exam  BP (!) 82/57   Pulse 76   Temp 97.9 F (36.6 C) (Oral)   Resp 16   Ht '5\' 4"'$  (1.626 m)   Wt 58.1 kg   SpO2 98%   BMI 21.97 kg/m  Physical Exam Gen: No acute distress  Resp: Normal rise and fall of chest Neuro: Moving all four extremities Psych: Resting currently, calm and cooperative when awake    ED Course / MDM  EKG:   I have reviewed the labs performed to date as well as medications administered while in observation.  Recent changes in the last 24 hours include no acute events overnight.  Plan  Current plan is for psychiatric evaluation for further disposition. Tracy Dominguez is under involuntary commitment.   Patient will intermittently have low blood pressures documented but has normal heart rate.  She is lying on the side where her blood pressure cuff is.  When we wake her up and check it again it improves into the upper 90s.  She denies any dizziness, chest pain or shortness of breath.  No further vomiting.  Tolerating p.o.   Maryse Brierley, Delice Bison, DO 03/07/21 [phone]

## 2021-03-07 NOTE — Plan of Care (Signed)
Patient new to the unit tonight, hasn't had time to progress  Problem: Education: Goal: Knowledge of [facility] Education information/materials will improve Outcome: Not Progressing Goal: Emotional status will improve Outcome: Not Progressing Goal: Mental status will improve Outcome: Not Progressing Goal: Verbalization of understanding the information provided will improve Outcome: Not Progressing   Problem: Safety: Goal: Periods of time without injury will increase Outcome: Not Progressing   Problem: Education: Goal: Utilization of techniques to improve thought processes will improve Outcome: Not Progressing Goal: Knowledge of the prescribed therapeutic regimen will improve Outcome: Not Progressing   Problem: Safety: Goal: Ability to disclose and discuss suicidal ideas will improve Outcome: Not Progressing Goal: Ability to identify and utilize support systems that promote safety will improve Outcome: Not Progressing

## 2021-03-07 NOTE — ED Notes (Signed)
Brother at bedside to visit with pt.

## 2021-03-07 NOTE — ED Notes (Signed)
Pt provided a change of clothes at this time.

## 2021-03-07 NOTE — ED Notes (Signed)
Report given to [facility], RN for inpt room 303

## 2021-03-07 NOTE — ED Triage Notes (Signed)
Pt in via [facility], reports ingesting 36 ibuprofen at approximately 2300.  Also reports having 3 margaritas tonight.  States, "I didn't want to die so I called yall."  A/Ox4, NAD noted at this time.

## 2021-03-07 NOTE — Tx Team (Signed)
Initial Treatment Plan 03/07/2021 8:35 PM [name] Self [id number]    PATIENT STRESSORS: Marital or family conflict Occupational concerns   PATIENT STRENGTHS: Ability for insight Motivation for treatment/growth   PATIENT IDENTIFIED PROBLEMS: Depression  Anxiety                   DISCHARGE CRITERIA:  Improved stabilization in mood, thinking, and/or behavior Verbal commitment to aftercare and medication compliance  PRELIMINARY DISCHARGE PLAN: Outpatient therapy Return to previous living arrangement  PATIENT/FAMILY INVOLVEMENT: This treatment plan has been presented to and reviewed with the patient, Tracy Dominguez. The patient has been given the opportunity to ask questions and make suggestions.  Mallie Darting, RN 03/07/2021, 8:35 PM

## 2021-03-07 NOTE — ED Notes (Signed)
Placed lunch tray at bedside.

## 2021-03-07 NOTE — ED Provider Notes (Signed)
[facility] Emergency Department Provider Note   ____________________________________________   Event Date/Time   First MD Initiated Contact with Patient 03/07/21 0002     (approximate)  I have reviewed the triage vital signs and the nursing notes.   HISTORY  Chief Complaint Drug Overdose    HPI Tracy Dominguez is a 20 y.o. female brought to the ED via EMS from home with a chief complaint of intentional overdose.  Patient admits to taking 36 tablets of ibuprofen approximately 1 hour prior to arrival.  Also consumed alcohol.  Vomited large amount en route to the ED.  Complained of chest pain to EMS.  Denies shortness of breath, abdominal pain, dysuria or diarrhea.  Denies HI/AH/VH.     Past Medical History:  Diagnosis Date   Anxiety    Depression    GERD (gastroesophageal reflux disease)     Patient Active Problem List   Diagnosis Date Noted   Situational mixed anxiety and depressive disorder 03/25/2020   Difficulty sleeping 03/25/2020   Gastroesophageal reflux disease without esophagitis 03/25/2020    History reviewed. No pertinent surgical history.  Prior to Admission medications   Medication Sig Start Date End Date Taking? Authorizing Provider  amitriptyline (ELAVIL) 25 MG tablet TAKE 1 TABLET BY MOUTH EVERYDAY AT BEDTIME 09/24/20   Vanga, Tally Due, MD  clonazePAM (KLONOPIN) 0.5 MG tablet TAKE 1/2 TO 1 TABLETS (0.25-0.5 MG TOTAL) BY MOUTH 2 (TWO) TIMES DAILY AS NEEDED FOR ANXIETY. 02/03/21   Birdie Sons, MD  Dexlansoprazole 30 MG capsule Take 1 capsule (30 mg total) by mouth daily. 07/30/20   Mar Daring, PA-C  etonogestrel (NEXPLANON) 68 MG IMPL implant 1 each by Subdermal route once.    [provider]  meloxicam (MOBIC) 7.5 MG tablet TAKE 1 TABLET (7.5 MG TOTAL) BY MOUTH DAILY. WITH FOOD 10/05/20   Mar Daring, PA-C  ondansetron (ZOFRAN-ODT) 4 MG disintegrating tablet Take 4 mg by mouth every 8 (eight) hours as  needed. 08/17/20   [provider]  pantoprazole (PROTONIX) 20 MG tablet Take 1 tablet (20 mg total) by mouth daily. 10/15/20 10/15/21  Naaman Plummer, MD    Allergies Patient has no known allergies.  Family History  Problem Relation Age of Onset   Depression Mother    Anxiety disorder Mother    Obesity Mother    Bipolar disorder Brother    Liver disease Maternal Grandmother    Kidney disease Maternal Grandmother    Breast cancer Maternal Grandmother    Kidney disease Paternal Grandmother    Hypertension Paternal Grandfather    Healthy Brother     Social History Social History   Tobacco Use   Smoking status: Never   Smokeless tobacco: Never  Vaping Use   Vaping Use: Every day  Substance Use Topics   Alcohol use: Yes    Comment: occ   Drug use: Never    Review of Systems  Constitutional: Positive for intoxication.  No fever/chills Eyes: No visual changes. ENT: No sore throat. Cardiovascular: Denies chest pain. Respiratory: Denies shortness of breath. Gastrointestinal: No abdominal pain.  Positive for vomiting.  No diarrhea.  No constipation. Genitourinary: Negative for dysuria. Musculoskeletal: Negative for back pain. Skin: Negative for rash. Neurological: Negative for headaches, focal weakness or numbness. Psychiatric: Positive for intentional overdose.  ____________________________________________   PHYSICAL EXAM:  VITAL SIGNS: ED Triage Vitals  Enc Vitals Group     BP      Pulse  Resp      Temp      Temp src      SpO2      Weight      Height      Head Circumference      Peak Flow      Pain Score      Pain Loc      Pain Edu?      Excl. in [place]?     Constitutional: Alert and oriented.  Intoxicated appearing and in mild acute distress. Eyes: Conjunctivae are normal. PERRL. EOMI. Head: Atraumatic. Nose: No congestion/rhinnorhea. Mouth/Throat: Mucous membranes are moist.   Neck: No stridor.   Cardiovascular: Tachycardic rate,  regular rhythm. Grossly normal heart sounds.  Good peripheral circulation. Respiratory: Normal respiratory effort.  No retractions. Lungs CTAB. Gastrointestinal: Soft and nontender to light or deep palpation. No distention. No abdominal bruits. No CVA tenderness. Musculoskeletal: No lower extremity tenderness nor edema.  No joint effusions. Neurologic:  Normal speech and language. No gross focal neurologic deficits are appreciated.  Skin:  Skin is warm, dry and intact. No rash noted. Psychiatric: Mood and affect are flat. Speech and behavior are normal.  ____________________________________________   LABS (all labs ordered are listed, but only abnormal results are displayed)  Labs Reviewed  CBC WITH DIFFERENTIAL/PLATELET - Abnormal; Notable for the following components:      Result Value   Hemoglobin 15.1 (*)    All other components within normal limits  COMPREHENSIVE METABOLIC PANEL - Abnormal; Notable for the following components:   BUN <5 (*)    Calcium 8.5 (*)    All other components within normal limits  ETHANOL - Abnormal; Notable for the following components:   Alcohol, Ethyl (B) 239 (*)    All other components within normal limits  ACETAMINOPHEN LEVEL - Abnormal; Notable for the following components:   Acetaminophen (Tylenol), Serum <10 (*)    All other components within normal limits  SALICYLATE LEVEL - Abnormal; Notable for the following components:   Salicylate Lvl Q000111Q (*)    All other components within normal limits  URINALYSIS, COMPLETE (UACMP) WITH MICROSCOPIC - Abnormal; Notable for the following components:   Color, Urine YELLOW (*)    APPearance CLEAR (*)    Hgb urine dipstick LARGE (*)    Bacteria, UA RARE (*)    All other components within normal limits  URINE DRUG SCREEN, QUALITATIVE ([facility] ONLY) - Abnormal; Notable for the following components:   Cannabinoid 50 Ng, Ur [place] POSITIVE (*)    All other components within normal limits  ACETAMINOPHEN LEVEL -  Abnormal; Notable for the following components:   Acetaminophen (Tylenol), Serum <10 (*)    All other components within normal limits  SALICYLATE LEVEL - Abnormal; Notable for the following components:   Salicylate Lvl Q000111Q (*)    All other components within normal limits  LIPASE, BLOOD  POC URINE PREG, ED  POC URINE PREG, ED  TROPONIN I (HIGH SENSITIVITY)  TROPONIN I (HIGH SENSITIVITY)   ____________________________________________  EKG  ED ECG REPORT I, Bailey Faiella J, the attending physician, personally viewed and interpreted this ECG.   Date: 03/07/2021  EKG Time: 0005  Rate: 103  Rhythm: sinus tachycardia  Axis: Normal  Intervals:none  ST&T Change: Nonspecific  ____________________________________________  RADIOLOGY I, Charli Liberatore J, personally viewed and evaluated these images (plain radiographs) as part of my medical decision making, as well as reviewing the written report by the radiologist.  ED MD interpretation: No acute cardiopulmonary process  Official radiology report(s): DG Chest Port 1 View  Result Date: 03/07/2021 CLINICAL DATA:  Overdose of ibuprofen. EXAM: PORTABLE CHEST 1 VIEW COMPARISON:  None. FINDINGS: The heart size and mediastinal contours are within normal limits. Both lungs are clear. The visualized skeletal structures are unremarkable. IMPRESSION: No active disease. Electronically Signed   By: Lucienne Capers M.D.   On: 03/07/2021 00:47    ____________________________________________   PROCEDURES  Procedure(s) performed (including Critical Care):  .1-3 Lead EKG Interpretation  Date/Time: 03/07/2021 12:07 AM Performed by: Paulette Blanch, MD Authorized by: Paulette Blanch, MD    CRITICAL CARE Performed by: Paulette Blanch   Total critical care time: 30 minutes  Critical care time was exclusive of separately billable procedures and treating other patients.  Critical care was necessary to treat or prevent imminent or life-threatening  deterioration.  Critical care was time spent personally by me on the following activities: development of treatment plan with patient and/or surrogate as well as nursing, discussions with consultants, evaluation of patient's response to treatment, examination of patient, obtaining history from patient or surrogate, ordering and performing treatments and interventions, ordering and review of laboratory studies, ordering and review of radiographic studies, pulse oximetry and re-evaluation of patient's condition.  ____________________________________________   INITIAL IMPRESSION / ASSESSMENT AND PLAN / ED COURSE  As part of my medical decision making, I reviewed the following data within the [facility] notes reviewed and incorporated, Labs reviewed, EKG interpreted, Old chart reviewed, Radiograph reviewed, A consult was requested and obtained from this/these consultant(s) Psychiatry, and Notes from prior ED visits     20 year old female presenting with intentional ibuprofen overdose.  Differential diagnosis includes but is not limited to: co-ingestion, cardiac arrhythmia, metabolic derangements, etc.  Will obtain toxicological lab work and urine.  Chest x-ray.  Initiate IV fluid resuscitation, IV Zofran, charcoal.  Will consult poison control.  Will place patient under IVC for her safety.  Clinical Course as of 03/07/21 0540  Sat Mar 07, 2021  0036 D/w Poison Control who agrees with supportive care, repeat 4 hour Tylenol levels. [JS]  0303 Patient moved to the psychiatric area of the emergency department.  Remains hemodynamically stable.  Cannabinoids and UDS.  Pending repeat acetaminophen and salicylate levels at 4 AM.  If these remain normal, anticipate patient may be medically cleared for psychiatric evaluation and disposition.  Patient remains in the ED under IVC. [JS]  0532 Repeat acetaminophen and salicylate levels remain negative.  At this time patient is medically  cleared for psychiatric evaluation and disposition. [JS]    Clinical Course User Index [JS] Paulette Blanch, MD     ____________________________________________   FINAL CLINICAL IMPRESSION(S) / ED DIAGNOSES  Final diagnoses:  Intentional drug overdose, initial encounter [facility])  Alcoholic intoxication without complication [facility])     ED Discharge Orders     None        Note:  This document was prepared using Dragon voice recognition software and may include unintentional dictation errors.    Paulette Blanch, MD 03/07/21 [phone]

## 2021-03-07 NOTE — ED Notes (Signed)
Pt provided phone, informed can use for 10 minutes.

## 2021-03-07 NOTE — Progress Notes (Signed)
Patient admitted from [facility] - ED, report received from D, RN. Patient calm and pleasant during assessment. Pt denies SI/HI/AVH at this time. Pt said she drank a lot and got depressed so she took 36 ibuprofen. Patient stated she then got scared and came to the ED. Pt states she doesn't want to die she just has some stressors that made her depressed. Pt given support. Pt compliant with medication administration per MD orders. Pt skin assessment completed with Cleo, no abnormalities found. No contraband found on patient or in her belongings. Pt given education about procedures on the unit. Patient being monitored Q 15 minutes for safety per unit protocol. Pt remains safe on the unit.

## 2021-03-07 NOTE — Consult Note (Addendum)
This provider spoke to her mother and answered questions once client provided permission.  She reports she is "not good with taking her depression medications as they don't work immediately."  She was recently at [facility] with alcohol intoxication and depression.  Her mother is taking care of her cat, emotional support animal, and will bring her clothes tomorrow.  Waylan Boga, PMHNP

## 2021-03-07 NOTE — ED Notes (Signed)
Spoke with Event organiser at [employer]. Updates provided on labwork and treatments. No abnormal changes noted in pt's vital signs, labs, or behavior. Poison Control is now closing out the case, but will be a resource for any future questions.

## 2021-03-07 NOTE — ED Notes (Signed)
All jewelry removed except for one bracelet on left wrist and nose ring.  X 2 gold hoop earrings X 2 gold necklace X 8 bracelets

## 2021-03-08 MED ORDER — LORAZEPAM 2 MG/ML IJ SOLN: 1.0000 mg | INTRAMUSCULAR | Status: DC | PRN

## 2021-03-08 MED ORDER — LORAZEPAM 1 MG PO TABS: 1.0000 mg | ORAL_TABLET | ORAL | Status: DC | PRN

## 2021-03-08 MED ORDER — ESCITALOPRAM OXALATE 10 MG PO TABS
10.0000 mg | ORAL_TABLET | Freq: Every day | ORAL | Status: DC
  Administered 2021-03-09 – 2021-03-11 (×3): 10 mg via ORAL
  Filled 2021-03-08 (×4): qty 1

## 2021-03-08 MED ORDER — CYCLOBENZAPRINE HCL 10 MG PO TABS
5.0000 mg | ORAL_TABLET | Freq: Two times a day (BID) | ORAL | Status: DC | PRN
  Administered 2021-03-08 – 2021-03-10 (×6): 5 mg via ORAL
  Filled 2021-03-08 (×6): qty 1

## 2021-03-08 MED ORDER — ADULT MULTIVITAMIN W/MINERALS CH
1.0000 | ORAL_TABLET | Freq: Every day | ORAL | Status: DC
  Administered 2021-03-08 – 2021-03-11 (×4): 1 via ORAL
  Filled 2021-03-08 (×4): qty 1

## 2021-03-08 NOTE — [facility] Group Notes (Addendum)
LCSW Group Therapy Note     03/08/2021 1:00PM     Type of Therapy and Topic:  Group Therapy: Coping Skills     Participation Level:  Active       Description of Group: In this group, patients will learn about coping strategies. Patients will define what a coping strategy is and discuss how utilizing coping strategies can aid in emotional regulation and assist in the management of stress, anxiety, and depression symptoms. Patients will identify coping strategies that have helped them manage challenging emotions in the past and explore new coping strategies that they can begin to implement during times of emotional distress. Patients will learn the difference between positive and negative coping strategies and be encouraged to identify healthy replacements for unhelpful behaviors. Patients will be facilitated through the practice of a grounding technique to add to their coping strategy toolbox.         Therapeutic Goals:  1. Patient will be able to define what a coping strategy is and understand the importance of using coping skills for emotional well-being.  2. Patient will identify three coping strategies that they can practice when experiencing feelings of stress, anxiety, anger, and/or depression.  3. Patient will identify one unhelpful coping strategy they have used prior to admission and identify a healthy replacement they can practice instead.  4. Patient will learn one new grounding technique they can practice after discharge to aid in emotional regulation.      Summary of Patient Progress: Patient was present for the entirety of the group session. Patient was an active listener and participated in the topic of discussion, provided helpful advice to others, and added nuance to topic of conversation. Patient presented as calm and cooperative. Patient discussed how alcohol use is an unhealthy coping skill she has used in the past. Patient identified several healthy coping  skills, including drawing, longboarding, and motivational quotes. Patient committed to cleaning, staying healthy, and exercising as new coping skills she will try after discharge.          Therapeutic Modalities:  Cognitive Behavioral Therapy Relapse Prevention Therapy Solution-Focused Therapy    Berniece Salines, MSW, [place] 03/08/2021 4:56 PM

## 2021-03-08 NOTE — H&P (Signed)
Psychiatric Admission Assessment Adult  Patient Identification: Tracy Dominguez MRN:  [id number] Date of Evaluation:  03/08/2021 Chief Complaint:  S/p overdose  Diagnosis:   S/P Overdose Major Depressive Disorder, Recurrent, Severe with anxious distress Unspecified Anxiety disorder R/o GAD Alcohol Use Disorder, severe Cannabis Use Disorder, moderate GERD Chronic Back Pain  History of Present Illness:  Patient is a 20 year old female residing in [place] with her mother who graduated high school and June 2022. She was brought to the emergency via EMS following a intentional overdose on 36 tablets of ibuprofen on March 07, 2021. Poison control was consult salicylate and acetaminophen levels were unremarkable; other labs labs were wnl. UDS was positive for cannabis and her blood alcohol level was 239.  Her mother, as well as the patient, reports the onset of depression around the start of Covid-19 two years ago, and reports being moderate to severe. She reports drinking more alcohol starting to three months ago, most days of the week to mitigate her emotions. She was seen at [facility] emergency Department one month ago for suicidal ideation's in the context of alcohol and was sent home the next day afternoon after deemed stable. Patient was prescribed Lexapro 10 mg in December 2021 but never took it. Also,was prescribed Prozac in February 2022  She did not take that medication either. No history of other medication trials.  Patient reports that her alcohol decreased after the visit at the [facility] ER one month ago but then increased again this past week. She became drunk on Friday and Saturday. She was drinking with a friend, and after he left, she started feeling overwhelmed and so ingested pills. She then drink some more alcohol. Eventually she started feeling sick and so decided to tell her mother. "I got scared and didn't want to die."  Patient notes having a lot of time on her hands  because she does not have a job and then cannot apply for school because her driver's license has apparently expired and she cannot locate her birth certificate to get a new one. She feels stuck because of this She did have a break up with her boyfriend three weeks ago also.  Patient reports no hx emotional dysregulation, anger, impulse control issues or history of cutting behaviors. Patient reports no issues of sleep appetite or energy..  She reports anxiety to be a more permanent issue than even depression in recent weeks.However, she said it does not interfere with her daily functioning. Denies a history of hallucinations and paranoia.  Patient denies residual side effects on the overdose.  I did speak with her mother who confirms her length of depressive cognitions and symptomology that were reported. Her mother takes lexapro with good benefit for her depression.   Alcohol Screening: 1. How often do you have a drink containing alcohol?: 2 to 4 times a month 2. How many drinks containing alcohol do you have on a typical day when you are drinking?: 5 or 6 3. How often do you have six or more drinks on one occasion?: Less than monthly AUDIT-C Score: 5 4. How often during the last year have you found that you were not able to stop drinking once you had started?: Never 5. How often during the last year have you failed to do what was normally expected from you because of drinking?: Never 6. How often during the last year have you needed a first drink in the morning to get yourself going after a heavy drinking session?: Never 7.  How often during the last year have you had a feeling of guilt of remorse after drinking?: Never 8. How often during the last year have you been unable to remember what happened the night before because you had been drinking?: Never 9. Have you or someone else been injured as a result of your drinking?: No 10. Has a relative or friend or a doctor or another health worker  been concerned about your drinking or suggested you cut down?: No Alcohol Use Disorder Identification Test Final Score (AUDIT): 5  Psych hx: none  Past Medical History:  Past Medical History:  Diagnosis Date   Anxiety    Depression    GERD (gastroesophageal reflux disease)    History reviewed. No pertinent surgical history. Family History:  Family History  Problem Relation Age of Onset   Depression Mother    Anxiety disorder Mother    Obesity Mother    Bipolar disorder Brother    Liver disease Maternal Grandmother    Kidney disease Maternal Grandmother    Breast cancer Maternal Grandmother    Kidney disease Paternal Grandmother    Hypertension Paternal Grandfather    Healthy Brother    Family Psychiatric  History: Mother's side of family Tobacco Screening:   Social History:  Social History   Substance and Sexual Activity  Alcohol Use Yes   Comment: occ     Social History   Substance and Sexual Activity  Drug Use Never    Additional Social History: Marital status: Single Are you sexually active?: No Has your sexual activity been affected by drugs, alcohol, medication, or emotional stress?: n/a Does patient have children?: No                         Allergies:  No Known Allergies Lab Results:  Results for orders placed or performed during the hospital encounter of 03/06/21 (from the past 48 hour(s))  CBC with Differential     Status: Abnormal   Collection Time: 03/07/21 12:09 AM  Result Value Ref Range   WBC 4.9 4.0 - 10.5 K/uL   RBC 4.53 3.87 - 5.11 MIL/uL   Hemoglobin 15.1 (H) 12.0 - 15.0 g/dL   HCT 44.2 36.0 - 46.0 %   MCV 97.6 80.0 - 100.0 fL   MCH 33.3 26.0 - 34.0 pg   MCHC 34.2 30.0 - 36.0 g/dL   RDW 12.3 11.5 - 15.5 %   Platelets 252 150 - 400 K/uL   nRBC 0.0 0.0 - 0.2 %   Neutrophils Relative % 37 %   Neutro Abs 1.8 1.7 - 7.7 K/uL   Lymphocytes Relative 55 %   Lymphs Abs 2.7 0.7 - 4.0 K/uL   Monocytes Relative 7 %   Monocytes  Absolute 0.3 0.1 - 1.0 K/uL   Eosinophils Relative 1 %   Eosinophils Absolute 0.1 0.0 - 0.5 K/uL   Basophils Relative 0 %   Basophils Absolute 0.0 0.0 - 0.1 K/uL   Immature Granulocytes 0 %   Abs Immature Granulocytes 0.02 0.00 - 0.07 K/uL    Comment: Performed at [facility], [address] [place], [place] [address]  Comprehensive metabolic panel     Status: Abnormal   Collection Time: 03/07/21 12:09 AM  Result Value Ref Range   Sodium 143 135 - 145 mmol/L   Potassium 3.6 3.5 - 5.1 mmol/L   Chloride 108 98 - 111 mmol/L   CO2 29 22 - 32 mmol/L  Glucose, Bld 86 70 - 99 mg/dL    Comment: Glucose reference range applies only to samples taken after fasting for at least 8 hours.   BUN <5 (L) 6 - 20 mg/dL   Creatinine, Ser 0.64 0.44 - 1.00 mg/dL   Calcium 8.5 (L) 8.9 - 10.3 mg/dL   Total Protein 7.2 6.5 - 8.1 g/dL   Albumin 4.6 3.5 - 5.0 g/dL   AST 26 15 - 41 U/L   ALT 16 0 - 44 U/L   Alkaline Phosphatase 61 38 - 126 U/L   Total Bilirubin 0.4 0.3 - 1.2 mg/dL   GFR, Estimated >60 >60 mL/min    Comment: (NOTE) Calculated using the CKD-EPI Creatinine Equation (2021)    Anion gap 6 5 - 15    Comment: Performed at [facility], [address] [place], [place] [address]  Ethanol     Status: Abnormal   Collection Time: 03/07/21 12:09 AM  Result Value Ref Range   Alcohol, Ethyl (B) 239 (H) <10 mg/dL    Comment: (NOTE) Lowest detectable limit for serum alcohol is 10 mg/dL.  For medical purposes only. Performed at [facility], [place], [place] [address]   Acetaminophen level     Status: Abnormal   Collection Time: 03/07/21 12:09 AM  Result Value Ref Range   Acetaminophen (Tylenol), Serum <10 (L) 10 - 30 ug/mL    Comment: (NOTE) Therapeutic concentrations vary significantly. A range of 10-30 ug/mL  may be an effective concentration for many patients. However, some  are best treated at concentrations outside of this  range. Acetaminophen concentrations >150 ug/mL at 4 hours after ingestion  and >50 ug/mL at 12 hours after ingestion are often associated with  toxic reactions.  Performed at [facility], [address] [place], [place] [id number]   Salicylate level     Status: Abnormal   Collection Time: 03/07/21 12:09 AM  Result Value Ref Range   Salicylate Lvl Q000111Q (L) 7.0 - 30.0 mg/dL    Comment: Performed at [facility], [address] [place], [place] [address]  Lipase, blood     Status: None   Collection Time: 03/07/21 12:09 AM  Result Value Ref Range   Lipase 23 11 - 51 U/L    Comment: Performed at [facility], [place], [place] [address]  Troponin I (High Sensitivity)     Status: None   Collection Time: 03/07/21 12:09 AM  Result Value Ref Range   Troponin I (High Sensitivity) <2 <18 ng/L    Comment: (NOTE) Elevated high sensitivity troponin I (hsTnI) values and significant  changes across serial measurements may suggest ACS but many other  chronic and acute conditions are known to elevate hsTnI results.  Refer to the "Links" section for chest pain algorithms and additional  guidance. Performed at [facility], [address] [place],  [address]   Urinalysis, Complete w Microscopic     Status: Abnormal   Collection Time: 03/07/21 12:09 AM  Result Value Ref Range   Color, Urine YELLOW (A) YELLOW   APPearance CLEAR (A) CLEAR   Specific Gravity, Urine 1.013 1.005 - 1.030   pH 7.0 5.0 - 8.0   Glucose, UA NEGATIVE NEGATIVE mg/dL   Hgb urine dipstick LARGE (A) NEGATIVE   Bilirubin Urine NEGATIVE NEGATIVE   Ketones, ur NEGATIVE NEGATIVE mg/dL   Protein, ur NEGATIVE NEGATIVE mg/dL   Nitrite NEGATIVE NEGATIVE   Leukocytes,Ua NEGATIVE NEGATIVE  RBC / HPF 0-5 0 - 5 RBC/hpf   WBC, UA 0-5 0 - 5 WBC/hpf   Bacteria, UA RARE (A) NONE SEEN   Squamous Epithelial / LPF 0-5 0 - 5   Mucus PRESENT     Comment: Performed at  [facility], [address] [place], [place] [address]  Urine Drug Screen, Qualitative     Status: Abnormal   Collection Time: 03/07/21 12:09 AM  Result Value Ref Range   Tricyclic, Ur Screen NONE DETECTED NONE DETECTED   Amphetamines, Ur Screen NONE DETECTED NONE DETECTED   MDMA (Ecstasy)Ur Screen NONE DETECTED NONE DETECTED   Cocaine Metabolite,Ur [place] NONE DETECTED NONE DETECTED   Opiate, Ur Screen NONE DETECTED NONE DETECTED   Phencyclidine (PCP) Ur S NONE DETECTED NONE DETECTED   Cannabinoid 50 Ng, Ur [place] POSITIVE (A) NONE DETECTED   Barbiturates, Ur Screen NONE DETECTED NONE DETECTED   Benzodiazepine, Ur Scrn NONE DETECTED NONE DETECTED   Methadone Scn, Ur NONE DETECTED NONE DETECTED    Comment: (NOTE) Tricyclics + metabolites, urine    Cutoff 1000 ng/mL Amphetamines + metabolites, urine  Cutoff 1000 ng/mL MDMA (Ecstasy), urine              Cutoff 500 ng/mL Cocaine Metabolite, urine          Cutoff 300 ng/mL Opiate + metabolites, urine        Cutoff 300 ng/mL Phencyclidine (PCP), urine         Cutoff 25 ng/mL Cannabinoid, urine                 Cutoff 50 ng/mL Barbiturates + metabolites, urine  Cutoff 200 ng/mL Benzodiazepine, urine              Cutoff 200 ng/mL Methadone, urine                   Cutoff 300 ng/mL  The urine drug screen provides only a preliminary, unconfirmed analytical test result and should not be used for non-medical purposes. Clinical consideration and professional judgment should be applied to any positive drug screen result due to possible interfering substances. A more specific alternate chemical method must be used in order to obtain a confirmed analytical result. Gas chromatography / mass spectrometry (GC/MS) is the preferred confirm atory method. Performed at [facility], [address] [place], [place] [address]   POC urine preg, ED     Status: None   Collection Time: 03/07/21 12:15 AM  Result Value Ref Range   Preg Test,  Ur NEGATIVE NEGATIVE    Comment:        THE SENSITIVITY OF THIS METHODOLOGY IS >24 mIU/mL   Troponin I (High Sensitivity)     Status: None   Collection Time: 03/07/21  2:33 AM  Result Value Ref Range   Troponin I (High Sensitivity) <2 <18 ng/L    Comment: (NOTE) Elevated high sensitivity troponin I (hsTnI) values and significant  changes across serial measurements may suggest ACS but many other  chronic and acute conditions are known to elevate hsTnI results.  Refer to the "Links" section for chest pain algorithms and additional  guidance. Performed at [facility], [address] [place], [place] [address]   Acetaminophen level     Status: Abnormal   Collection Time: 03/07/21  4:29 AM  Result Value Ref Range   Acetaminophen (Tylenol), Serum <10 (L) 10 - 30 ug/mL    Comment: (NOTE) Therapeutic concentrations vary significantly. A  range of 10-30 ug/mL  may be an effective concentration for many patients. However, some  are best treated at concentrations outside of this range. Acetaminophen concentrations >150 ug/mL at 4 hours after ingestion  and >50 ug/mL at 12 hours after ingestion are often associated with  toxic reactions.  Performed at [facility], [address] [place], [place] [id number]   Salicylate level     Status: Abnormal   Collection Time: 03/07/21  4:29 AM  Result Value Ref Range   Salicylate Lvl Q000111Q (L) 7.0 - 30.0 mg/dL    Comment: Performed at [facility], [address] [place], [place] [address]  POC urine preg, ED     Status: None   Collection Time: 03/07/21  5:47 AM  Result Value Ref Range   Preg Test, Ur Negative Negative  Resp Panel by RT-PCR (Flu A&B, Covid) Nasopharyngeal Swab     Status: None   Collection Time: 03/07/21  4:26 PM   Specimen: Nasopharyngeal Swab; Nasopharyngeal(NP) swabs in vial transport medium  Result Value Ref Range   SARS Coronavirus 2 by RT PCR NEGATIVE NEGATIVE    Comment:  (NOTE) SARS-CoV-2 target nucleic acids are NOT DETECTED.  The SARS-CoV-2 RNA is generally detectable in upper respiratory specimens during the acute phase of infection. The lowest concentration of SARS-CoV-2 viral copies this assay can detect is 138 copies/mL. A negative result does not preclude SARS-Cov-2 infection and should not be used as the sole basis for treatment or other patient management decisions. A negative result may occur with  improper specimen collection/handling, submission of specimen other than nasopharyngeal swab, presence of viral mutation(s) within the areas targeted by this assay, and inadequate number of viral copies(<138 copies/mL). A negative result must be combined with clinical observations, patient history, and epidemiological information. The expected result is Negative.  Fact Sheet for Patients:  EntrepreneurPulse.com.au  Fact Sheet for Healthcare Providers:  IncredibleEmployment.be  This test is no t yet approved or cleared by the [place] FDA and  has been authorized for detection and/or diagnosis of SARS-CoV-2 by FDA under an Emergency Use Authorization (EUA). This EUA will remain  in effect (meaning this test can be used) for the duration of the COVID-19 declaration under Section 564(b)(1) of the Act, 21 U.S.C.section 360bbb-3(b)(1), unless the authorization is terminated  or revoked sooner.       Influenza A by PCR NEGATIVE NEGATIVE   Influenza B by PCR NEGATIVE NEGATIVE    Comment: (NOTE) The Xpert Xpress SARS-CoV-2/FLU/RSV plus assay is intended as an aid in the diagnosis of influenza from Nasopharyngeal swab specimens and should not be used as a sole basis for treatment. Nasal washings and aspirates are unacceptable for Xpert Xpress SARS-CoV-2/FLU/RSV testing.  Fact Sheet for Patients: EntrepreneurPulse.com.au  Fact Sheet for Healthcare  Providers: IncredibleEmployment.be  This test is not yet approved or cleared by the [place] FDA and has been authorized for detection and/or diagnosis of SARS-CoV-2 by FDA under an Emergency Use Authorization (EUA). This EUA will remain in effect (meaning this test can be used) for the duration of the COVID-19 declaration under Section 564(b)(1) of the Act, 21 U.S.C. section 360bbb-3(b)(1), unless the authorization is terminated or revoked.  Performed at [facility], [address], [place], [place] [address]     Blood Alcohol level:  Lab Results  Component Value Date   ETH 239 (H) 03/07/2021   ETH 131 (H) [id number]    Metabolic Disorder Labs:  No results found  for: HGBA1C, MPG No results found for: PROLACTIN No results found for: CHOL, TRIG, HDL, CHOLHDL, VLDL, LDLCALC  Current Medications: Current Facility-Administered Medications  Medication Dose Route Frequency Provider Last Rate Last Admin   acetaminophen (TYLENOL) tablet 650 mg  650 mg Oral Q6H PRN Patrecia Pour, NP   650 mg at 03/07/21 2043   alum & mag hydroxide-simeth (MAALOX/MYLANTA) 200-200-20 MG/5ML suspension 30 mL  30 mL Oral Q4H PRN Patrecia Pour, NP       clonazePAM Bobbye [place]) tablet 0.5 mg  0.5 mg Oral Daily PRN Patrecia Pour, NP   0.5 mg at 03/07/21 2132   cyclobenzaprine (FLEXERIL) tablet 5 mg  5 mg Oral BID PRN Rulon Sera, MD       escitalopram (LEXAPRO) tablet 10 mg  10 mg Oral Daily Rulon Sera, MD       LORazepam (ATIVAN) tablet 1-4 mg  1-4 mg Oral Q1H PRN Rulon Sera, MD       Or   LORazepam (ATIVAN) injection 1-4 mg  1-4 mg Intravenous Q1H PRN Rulon Sera, MD       magnesium hydroxide (MILK OF MAGNESIA) suspension 30 mL  30 mL Oral Daily PRN Patrecia Pour, NP       multivitamin with minerals tablet 1 tablet  1 tablet Oral Daily Rulon Sera, MD       pantoprazole (PROTONIX) EC tablet 40 mg  40 mg Oral Daily Patrecia Pour, NP   40 mg at 03/08/21 [id number]    traZODone (DESYREL) tablet 50 mg  50 mg Oral QHS PRN Deloria Lair, NP       PTA Medications: Medications Prior to Admission  Medication Sig Dispense Refill Last Dose   amitriptyline (ELAVIL) 25 MG tablet TAKE 1 TABLET BY MOUTH EVERYDAY AT BEDTIME 90 tablet 1    clonazePAM (KLONOPIN) 0.5 MG tablet TAKE 1/2 TO 1 TABLETS (0.25-0.5 MG TOTAL) BY MOUTH 2 (TWO) TIMES DAILY AS NEEDED FOR ANXIETY. 60 tablet 0    Dexlansoprazole 30 MG capsule Take 1 capsule (30 mg total) by mouth daily. 30 capsule 3    etonogestrel (NEXPLANON) 68 MG IMPL implant 1 each by Subdermal route once.      meloxicam (MOBIC) 7.5 MG tablet TAKE 1 TABLET (7.5 MG TOTAL) BY MOUTH DAILY. WITH FOOD 30 tablet 0    ondansetron (ZOFRAN-ODT) 4 MG disintegrating tablet Take 4 mg by mouth every 8 (eight) hours as needed.      pantoprazole (PROTONIX) 20 MG tablet Take 1 tablet (20 mg total) by mouth daily. 30 tablet 1       Psychiatric Specialty Exam:  Presentation  General Appearance:  Cooperateive Eye Contact: good  Mood and Affect  Mood: Depressed Affect: Incongruent, bright  Thought Process  Thought Processes: No data recorded Duration of Psychotic Symptoms: No data recorded Past Diagnosis of Schizophrenia or Psychoactive disorder: No  Descriptions of Associations:No data recorded Orientation:No data recorded Thought Content:No data recorded Hallucinations:No data recorded Ideas of Reference:No data recorded Suicidal Thoughts:No data recorded Homicidal Thoughts:No data recorded  Sensorium  Memory: fair Judgment: poor Insight: poor  Executive Functions  Concentration: good Attention Span: fair   Sleep  Sleep: No data recorded   Physical Exam: Physical Exam ROS Blood pressure 117/80, pulse 93, temperature 97.9 F (36.6 C), temperature source Oral, resp. rate 18, height '5\' 5"'$  (1.651 m), weight 56.2 kg, SpO2 100 %. Body mass index is 20.63 kg/m.  Treatment Plan Summary: Daily contact  with patient to  assess and evaluate symptoms and progress in treatment  Observation Level/Precautions:  Continuous Observation 15 minute checks    Psychotherapy:    Medications:    Consultations:    Discharge Concerns:    Estimated LOS:  Other:     Physician Treatment Plan for Primary Diagnosis: <principal problem not specified> Long Term Goal(s): Improvement in symptoms so as ready for discharge  Short Term Goals: Ability to identify changes in lifestyle to reduce recurrence of condition will improve  Physician Treatment Plan for Secondary Diagnosis: Active Problems:   Major depressive disorder, recurrent severe without psychotic features ([place])  Long Term Goal(s): Improvement in symptoms so as ready for discharge  Short Term Goals: Ability to verbalize feelings will improve and Ability to disclose and discuss suicidal ideas  PLAN:  Spoke with pt's mother Start CIWA protocol Labs and tests reviewed Start Lexapro '10mg'$ . Risk of suicidal ideations and manic switch reviewed as well as headaches, etc.  Establish and maintain a therapeutic allowance   I certify that inpatient services furnished can reasonably be expected to improve the patient's condition.    Rulon Sera, MD 8/21/202211:58 AM

## 2021-03-08 NOTE — Plan of Care (Addendum)
Patient during assessments this morning endorsed an improving mood and affect was euthymic to relaxed with appropriate brightening and fair eye contact. Pt. Denied si/hi/avh and endorsed ability to continue to remain safe on the unit. Pt. Orientation appeared grossly intact. Pt. Denied physical pain and endorsed toleration of medications thus far. Pt. Endorsed no problems with sleeping and or eating at this time. Pt. Had no complaints for this Probation officer.   Patient has been complaint with medications and unit procedures thus far. Pt. Has been observed with reduced intake thus far, but per patient, she normally does not eat breakfast, and this is not a deviation from her normal eating pattern. Pt. Has been able to remain safe on the unit thus far. Pt. Thus far has been observed resting in her room.    Q x 15 minute observation checks in place/maintained for safety. Patient is provided with education throughout shift when appropriate and able.  Patient is given/offered medications per orders. Patient is encouraged to attend groups, participate in unit activities and continue with plan of care. Pt. Chart and plans of care reviewed. Pt. Given support and encouragement when appropriate and able.      Problem: Education: Goal: Knowledge of [place] [facility] Education information/materials will improve Outcome: Progressing Note: Pt. Verbalized understanding of information provided.   Goal: Emotional status will improve Outcome: Progressing Note: Pt. Endorsed a better mood today. Pt. Denied si/hi/avh, endorsed an ability to continue to remain safe on the unit.   Goal: Mental status will improve Outcome: Progressing Note: Pt. Endorsed a better mood today. Pt. Denied si/hi/avh, endorsed an ability to continue to remain safe on the unit.    Problem: Safety: Goal: Periods of time without injury will increase Outcome: Progressing Note: Pt. Endorsed a better mood today. Pt. Denied si/hi/avh, endorsed an  ability to continue to remain safe on the unit. Pt. Has been able to remain safe on the unit thus far.    Problem: Safety: Goal: Ability to disclose and discuss suicidal ideas will improve Outcome: Progressing Note: Pt. Endorsed a better mood today. Pt. Denied si/hi/avh, endorsed an ability to continue to remain safe on the unit. Pt. Has been able to remain safe on the unit thus far.
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
## 2021-03-08 NOTE — [facility] Suicide Risk Assessment (Signed)
[facility] Admission Suicide Risk Assessment   Nursing information obtained from:  Patient Demographic factors:  Caucasian, Unemployed Current Mental Status:  Plan includes specific time, place, or method Loss Factors:  Legal issues, Financial problems / change in socioeconomic status Historical Factors:  Impulsivity Risk Reduction Factors:  Living with another person, especially a relative, Positive social support, Positive therapeutic relationship  Total Time spent with patient: 1 hour Principal Problem: <principal problem not specified> Diagnosis:  Active Problems:   Major depressive disorder, recurrent severe without psychotic features ([place])  Subjective Data:  History of Present Illness:  Patient is a 20 year old female residing in [place] with her mother who graduated high school and June 2022. She was brought to the emergency via EMS following a intentional overdose on 36 tablets of ibuprofen on March 07, 2021. Poison control was consult salicylate and acetaminophen levels were unremarkable; other labs labs were wnl. UDS was positive for cannabis and her blood alcohol level was 239.   Her mother, as well as the patient, reports the onset of depression around the start of Covid-19 two years ago, and reports being moderate to severe. She reports drinking more alcohol starting to three months ago, most days of the week to mitigate her emotions. She was seen at [facility] emergency Department one month ago for suicidal ideation's in the context of alcohol and was sent home the next day afternoon after deemed stable. Patient was prescribed Lexapro 10 mg in December 2021 but never took it. Also,was prescribed Prozac in February 2022  She did not take that medication either. No history of other medication trials.   Patient reports that her alcohol decreased after the visit at the [facility] ER one month ago but then increased again this past week. She became drunk on Friday and Saturday. She was  drinking with a friend, and after he left, she started feeling overwhelmed and so ingested pills. She then drink some more alcohol. Eventually she started feeling sick and so decided to tell her mother. "I got scared and didn't want to die."   Patient notes having a lot of time on her hands because she does not have a job and then cannot apply for school because her driver's license has apparently expired and she cannot locate her birth certificate to get a new one. She feels stuck because of this She did have a break up with her boyfriend three weeks ago also.   Patient reports no hx emotional dysregulation, anger, impulse control issues or history of cutting behaviors. Patient reports no issues of sleep appetite or energy..   She reports anxiety to be a more permanent issue than even depression in recent weeks.However, she said it does not interfere with her daily functioning. Denies a history of hallucinations and paranoia.   Patient denies residual side effects on the overdose.   I did speak with her mother who confirms her length of depressive cognitions and symptomology that were reported. Her mother takes lexapro with good benefit for her depression.   Continued Clinical Symptoms:  Alcohol Use Disorder Identification Test Final Score (AUDIT): 5 The "Alcohol Use Disorders Identification Test", Guidelines for Use in Primary Care, Second Edition.  World Pharmacologist [facility]). Score between 0-7:  no or low risk or alcohol related problems. Score between 8-15:  moderate risk of alcohol related problems. Score between 16-19:  high risk of alcohol related problems. Score 20 or above:  warrants further diagnostic evaluation for alcohol dependence and treatment.  Psychiatric Specialty  Exam:   Presentation  General Appearance:  Cooperateive Eye Contact: good   Mood and Affect  Mood: Depressed Affect: Incongruent, bright   Thought Process  Thought Processes: No data  recorded Duration of Psychotic Symptoms: No data recorded Past Diagnosis of Schizophrenia or Psychoactive disorder: No   Descriptions of Associations:No data recorded Orientation:No data recorded Thought Content:No data recorded Hallucinations:No data recorded Ideas of Reference:No data recorded Suicidal Thoughts:No data recorded Homicidal Thoughts:No data recorded   Sensorium  Memory: fair Judgment: poor Insight: poor   Executive Functions  Concentration: good Attention Span: fair     Sleep  Sleep: No data recorded   CLINICAL FACTORS:   Depression:   Anhedonia   Physical Exam: Physical Exam ROS Blood pressure 117/80, pulse 93, temperature 97.9 F (36.6 C), temperature source Oral, resp. rate 18, height '5\' 5"'$  (1.651 m), weight 56.2 kg, SpO2 100 %. Body mass index is 20.63 kg/m.   COGNITIVE FEATURES THAT CONTRIBUTE TO RISK:  None    SUICIDE RISK:   Severe:  Frequent, intense, and enduring suicidal ideation, specific plan, no subjective intent, but some objective markers of intent (i.e., choice of lethal method), the method is accessible, some limited preparatory behavior, evidence of impaired self-control, severe dysphoria/symptomatology, multiple risk factors present, and few if any protective factors, particularly a lack of social support.   I certify that inpatient services furnished can reasonably be expected to improve the patient's condition.   Rulon Sera, MD 03/08/2021, 12:12 PM

## 2021-03-09 DIAGNOSIS — F1020 Alcohol dependence, uncomplicated: Secondary | ICD-10-CM | POA: Diagnosis present

## 2021-03-09 DIAGNOSIS — F411 Generalized anxiety disorder: Secondary | ICD-10-CM | POA: Diagnosis present

## 2021-03-09 DIAGNOSIS — F332 Major depressive disorder, recurrent severe without psychotic features: Secondary | ICD-10-CM | POA: Diagnosis not present

## 2021-03-09 MED ORDER — CLONAZEPAM 0.5 MG PO TABS
0.5000 mg | ORAL_TABLET | Freq: Two times a day (BID) | ORAL | Status: DC | PRN
  Administered 2021-03-09 – 2021-03-10 (×4): 0.5 mg via ORAL
  Filled 2021-03-09 (×4): qty 1

## 2021-03-09 NOTE — Progress Notes (Signed)
Patient is calm and cooperative with assessment. Patient denies suicidal ideations, homicidal ideations, and auditory and visual hallucinations. She states that she just wants to get back to sleep because she is not used to waking up so early. Patient states that her back is hurting due to the beds on the unit and explains that she has had back problems since middle school. Flexeril PRN was given for muscle spasms. Klonopin PRN was also given for anxiety. Patient was compliant with all scheduled morning medications. She initially felt like she should start the Lexapro after discharge, but after education and encouragement, took the medication. Patient is observed to be interacting appropriately with staff and other patients on the unit She is seen using the phone multiple times throughout the morning. Patient remains safe on the unit at this time.

## 2021-03-09 NOTE — Progress Notes (Signed)
[facility] MD Progress Note  03/09/2021 10:24 AM Meridyth Schade  MRN:  [id number]  Tracy Dominguez is a 20 year old female who was brought into the emergency room via EMS on 03/15/2021 after ingesting 36 tablets of ibuprofen and drinking alcohol.  Patient states that she told her mother after she had done this because she got scared and did not want to die.  UDS was positive for cannabis.  Blood alcohol level was 239.  Patient has history of similar presentation at [facility] about a month ago after drinking and endorsing suicidal ideations.  " I think I have a problem with not knowing when to stop drinking." Subjective: Patient was seen and interviewed in her room this morning.  She is somewhat guarded, but pleasant and cooperative with interview.  She states that taking the pills was very impulsive and that she really was not thinking of killing herself.  She states that the incident that brought her to [facility] a month ago was when she [place] and slipped and hit her head on a rock when she was drunk.  She denies ever telling anyone at the hospital that she was suicidal.  Patient denies current suicidal ideations, homicidal ideations, auditory or visual hallucinations.  Patient states that she never really had suicidal thoughts.  She admits to depression, but expresses forward thinking.  Patient states that she only drinks on the weekends and sometimes will get drunk.  She states that her driver's license has expired and she has misplaced her documents to get a new one.  Her goal is to get her license, get a job, go to [employer] and transfer to [employer] to become a Animal nutritionist.  Patient had questions about starting medication now, asking why she would not just start when she leaves the hospital.  Discussed with patient protocol and that we try to get medication on board as the sooner it is started the sooner she may get some relief.  She may have to wait for a  couple weeks to get appointment with therapy and medication management.  Patient expressed understanding and is agreeable with plan.  She wants to leave the hospital as soon as possible.  Encouraged patient to participate in milieu activities. Principal Problem: Major depressive disorder, recurrent severe without psychotic features ([place]) Diagnosis: Principal Problem:   Major depressive disorder, recurrent severe without psychotic features ([place])  Total Time spent with patient: 20 minutes  Past Psychiatric History: Patient seen at [facility] in June 2022 for alcohol intoxication.  She states she has never been to a therapist.  She has been given trials of Lexapro and Prozac in the past, but did not take them.  Patient willing to take Lexapro and go to therapy  Past Medical History:  Past Medical History:  Diagnosis Date   Anxiety    Depression    GERD (gastroesophageal reflux disease)    History reviewed. No pertinent surgical history. Family History:  Family History  Problem Relation Age of Onset   Depression Mother    Anxiety disorder Mother    Obesity Mother    Bipolar disorder Brother    Liver disease Maternal Grandmother    Kidney disease Maternal Grandmother    Breast cancer Maternal Grandmother    Kidney disease Paternal Grandmother    Hypertension Paternal Grandfather    Healthy Brother    Family Psychiatric  History:  Mother has depression; on Lexapro Social History:  Social History  Substance and Sexual Activity  Alcohol Use Yes   Comment: occ     Social History   Substance and Sexual Activity  Drug Use Never    Social History   Socioeconomic History   Marital status: Single    Spouse name: Not on file   Number of children: Not on file   Years of education: Not on file   Highest education level: Not on file  Occupational History   Occupation: student  Tobacco Use   Smoking status: Never   Smokeless tobacco: Never  Vaping Use   Vaping Use: Every day   Substance and Sexual Activity   Alcohol use: Yes    Comment: occ   Drug use: Never   Sexual activity: Yes    Birth control/protection: Implant  Other Topics Concern   Not on file  Social History Narrative   Not on file   Social Determinants of Health   Financial Resource Strain: Not on file  Food Insecurity: Not on file  Transportation Needs: Not on file  Physical Activity: Not on file  Stress: Not on file  Social Connections: Not on file   Additional Social History:      Sleep: Fair  Appetite:  Good  Current Medications: Current Facility-Administered Medications  Medication Dose Route Frequency Provider Last Rate Last Admin   acetaminophen (TYLENOL) tablet 650 mg  650 mg Oral Q6H PRN Patrecia Pour, NP   650 mg at 03/07/21 2043   alum & mag hydroxide-simeth (MAALOX/MYLANTA) 200-200-20 MG/5ML suspension 30 mL  30 mL Oral Q4H PRN Patrecia Pour, NP       clonazePAM Bobbye [place]) tablet 0.5 mg  0.5 mg Oral Daily PRN Patrecia Pour, NP   0.5 mg at 03/08/21 2104   cyclobenzaprine (FLEXERIL) tablet 5 mg  5 mg Oral BID PRN Rulon Sera, MD   5 mg at 03/08/21 1942   escitalopram (LEXAPRO) tablet 10 mg  10 mg Oral Daily Rulon Sera, MD   10 mg at 03/09/21 [id number]   LORazepam (ATIVAN) tablet 1-4 mg  1-4 mg Oral Q1H PRN Rulon Sera, MD       Or   LORazepam (ATIVAN) injection 1-4 mg  1-4 mg Intravenous Q1H PRN Rulon Sera, MD       magnesium hydroxide (MILK OF MAGNESIA) suspension 30 mL  30 mL Oral Daily PRN Patrecia Pour, NP       multivitamin with minerals tablet 1 tablet  1 tablet Oral Daily Rulon Sera, MD   1 tablet at 03/09/21 0752   pantoprazole (PROTONIX) EC tablet 40 mg  40 mg Oral Daily Patrecia Pour, NP   40 mg at 03/09/21 [id number]   traZODone (DESYREL) tablet 50 mg  50 mg Oral QHS PRN Deloria Lair, NP   50 mg at 03/08/21 2104    Lab Results:  Results for orders placed or performed during the hospital encounter of 03/06/21 (from the past 48 hour(s))  Resp Panel by  RT-PCR (Flu A&B, Covid) Nasopharyngeal Swab     Status: None   Collection Time: 03/07/21  4:26 PM   Specimen: Nasopharyngeal Swab; Nasopharyngeal(NP) swabs in vial transport medium  Result Value Ref Range   SARS Coronavirus 2 by RT PCR NEGATIVE NEGATIVE    Comment: (NOTE) SARS-CoV-2 target nucleic acids are NOT DETECTED.  The SARS-CoV-2 RNA is generally detectable in upper respiratory specimens during the acute phase of infection. The lowest concentration of SARS-CoV-2 viral copies this assay can detect is  138 copies/mL. A negative result does not preclude SARS-Cov-2 infection and should not be used as the sole basis for treatment or other patient management decisions. A negative result may occur with  improper specimen collection/handling, submission of specimen other than nasopharyngeal swab, presence of viral mutation(s) within the areas targeted by this assay, and inadequate number of viral copies(<138 copies/mL). A negative result must be combined with clinical observations, patient history, and epidemiological information. The expected result is Negative.  Fact Sheet for Patients:  EntrepreneurPulse.com.au  Fact Sheet for Healthcare Providers:  IncredibleEmployment.be  This test is no t yet approved or cleared by the [place] FDA and  has been authorized for detection and/or diagnosis of SARS-CoV-2 by FDA under an Emergency Use Authorization (EUA). This EUA will remain  in effect (meaning this test can be used) for the duration of the COVID-19 declaration under Section 564(b)(1) of the Act, 21 U.S.C.section 360bbb-3(b)(1), unless the authorization is terminated  or revoked sooner.       Influenza A by PCR NEGATIVE NEGATIVE   Influenza B by PCR NEGATIVE NEGATIVE    Comment: (NOTE) The Xpert Xpress SARS-CoV-2/FLU/RSV plus assay is intended as an aid in the diagnosis of influenza from Nasopharyngeal swab specimens and should not be  used as a sole basis for treatment. Nasal washings and aspirates are unacceptable for Xpert Xpress SARS-CoV-2/FLU/RSV testing.  Fact Sheet for Patients: EntrepreneurPulse.com.au  Fact Sheet for Healthcare Providers: IncredibleEmployment.be  This test is not yet approved or cleared by the [place] FDA and has been authorized for detection and/or diagnosis of SARS-CoV-2 by FDA under an Emergency Use Authorization (EUA). This EUA will remain in effect (meaning this test can be used) for the duration of the COVID-19 declaration under Section 564(b)(1) of the Act, 21 U.S.C. section 360bbb-3(b)(1), unless the authorization is terminated or revoked.  Performed at [facility], [address] [place], [place] [address]     Blood Alcohol level:  Lab Results  Component Value Date   ETH 239 (H) 03/07/2021   ETH 131 (H) [id number]    Metabolic Disorder Labs: No results found for: HGBA1C, MPG No results found for: PROLACTIN No results found for: CHOL, TRIG, HDL, CHOLHDL, VLDL, LDLCALC  Physical Findings: AIMS:  , ,  ,  ,    CIWA:  CIWA-Ar Total: 0 COWS:     Musculoskeletal: Strength & Muscle Tone: within normal limits Gait & Station: normal Patient leans: N/A  Psychiatric Specialty Exam:  Presentation  General Appearance:  Appropriate for Environment Eye Contact: Good Speech: Clear and Coherent Speech Volume: Normal Handedness: No data recorded  Mood and Affect  Mood: Depressed Affect: Appropriate  Thought Process  Thought Processes: Coherent Descriptions of Associations:Intact Orientation:No data recorded Thought Content:No data recorded History of Schizophrenia/Schizoaffective disorder:No  Duration of Psychotic Symptoms:No data recorded Hallucinations:Hallucinations: None (Denies) Ideas of Reference:None (Denies) Suicidal Thoughts:Suicidal Thoughts: No (Denies) Homicidal Thoughts:Homicidal Thoughts:  No (Denies)  Sensorium  Memory: Immediate Good; Recent Good; Remote Good Judgment: Poor Insight: Poor  Executive Functions  Concentration: Good Attention Span: Good Recall: Good Fund of Knowledge: Fair; Good Language: Good  Psychomotor Activity  Psychomotor Activity: Psychomotor Activity: Normal  Assets  Assets: Desire for Improvement; Financial Resources/Insurance; Housing; Social Support; Resilience; Physical Health; Leisure Time; Vocational/Educational  Sleep  Sleep: Sleep: Good Number of Hours of Sleep: 8.25   Physical Exam: Physical Exam Vitals and nursing note reviewed.  HENT:     Head: Normocephalic.     Nose: No congestion  or rhinorrhea.  Eyes:     General:        Right eye: No discharge.        Left eye: No discharge.  Cardiovascular:     Rate and Rhythm: Normal rate.  Pulmonary:     Effort: Pulmonary effort is normal.  Musculoskeletal:        General: Normal range of motion.     Cervical back: Normal range of motion.  Skin:    General: Skin is dry.  Neurological:     Mental Status: She is alert and oriented to person, place, and time.  Psychiatric:        Attention and Perception: Attention normal.        Mood and Affect: Mood is depressed.        Speech: Speech normal.        Behavior: Behavior is cooperative.        Thought Content: Thought content normal.   Review of Systems  Psychiatric/Behavioral:  Positive for depression and substance abuse (Hx of alcohol misuse). Negative for hallucinations, memory loss and suicidal ideas. The patient is not nervous/anxious and does not have insomnia.   All other systems reviewed and are negative. Blood pressure 110/74, pulse 76, temperature 98.2 F (36.8 C), temperature source Oral, resp. rate 17, height '5\' 5"'$  (1.651 m), weight 56.2 kg, SpO2 99 %. Body mass index is 20.63 kg/m.   Treatment Plan Summary: Daily contact with patient to assess and evaluate symptoms and progress in treatment and  Medication management.  03/09/2021 Update: Patient expresses poor insight to her depression and alcohol misuse.  We will continue Lexapro and encourage group participation to learn coping skills.  Patient will need resources for follow-up.  Continue CIWA protocol for 72 hours starting 03/08/2021 at 1155 - Continue Ativan 1 to 4 mg oral or IV every hour as needed for withdrawal symptoms of anxiety, agitation, insomnia, diaphoresis, nausea, vomiting, tremors, tachycardia, hypertension  Anxiety - Continue clonazepam 0.5 mg oral daily as needed  Muscle spasms - Continue Flexeril 5 mg oral 2 times daily as needed  Depression - Continue Lexapro 10 mg oral daily  Supplementation - Continue multivitamin with minerals tablet p.o. daily  GERD - Continue Protonix EC tablet 40 mg oral daily  PRN, Other  --Continue Tylenol 650 mg po every 6 hrs prn pain --Continue MAALOX/MYLANTA 30 mL po every 4 hrs prn indigestion --Continue Milk of Magnesia 30 mL po daily prn constipation    -Continue trazodone 50 mg tablet oral at bedtime as needed for sleep  Sherlon Handing, NP 03/09/2021, 10:24 AM

## 2021-03-09 NOTE — Plan of Care (Signed)
  Problem: Education: Goal: Knowledge of [place] [facility] Education information/materials will improve Outcome: Progressing Goal: Emotional status will improve Outcome: Progressing Goal: Mental status will improve Outcome: Progressing Goal: Verbalization of understanding the information provided will improve Outcome: Progressing   Problem: Safety: Goal: Periods of time without injury will increase Outcome: Progressing   Problem: Education: Goal: Utilization of techniques to improve thought processes will improve Outcome: Progressing Goal: Knowledge of the prescribed therapeutic regimen will improve Outcome: Progressing   Problem: Safety: Goal: Ability to disclose and discuss suicidal ideas will improve Outcome: Progressing Goal: Ability to identify and utilize support systems that promote safety will improve Outcome: Progressing

## 2021-03-09 NOTE — [facility] Group Notes (Signed)
LCSW Group Therapy Note     03/09/2021 4:06 PM     Type of Therapy and Topic:  Group Therapy:  Overcoming Obstacles     Participation Level:  Did Not Attend     Description of Group:     In this group patients will be encouraged to explore what they see as obstacles to their own wellness and recovery. They will be guided to discuss their thoughts, feelings, and behaviors related to these obstacles. The group will process together ways to cope with barriers, with attention given to specific choices patients can make. Each patient will be challenged to identify changes they are motivated to make in order to overcome their obstacles. This group will be process-oriented, with patients participating in exploration of their own experiences as well as giving and receiving support and challenge from other group members.     Therapeutic Goals:  1.    Patient will identify personal and current obstacles as they relate to admission.  2.    Patient will identify barriers that currently interfere with their wellness or overcoming obstacles.  3.    Patient will identify feelings, thought process and behaviors related to these barriers.  4.    Patient will identify two changes they are willing to make to overcome these obstacles:        Summary of Patient Progress: X     Therapeutic Modalities:    Cognitive Behavioral Therapy  Solution Focused Therapy  Motivational Interviewing  Relapse Prevention Therapy     Torrey Ballinas [place], MSW, [place]  03/09/2021 4:06 PM

## 2021-03-09 NOTE — Progress Notes (Signed)
Recreation Therapy Notes  Date: 03/09/2021  Time: 9:40 am   Location: Craft room   Behavioral response: Appropriate  Intervention Topic: Goals    Discussion/Intervention:  Group content on today was focused on goals. Patients described what goals are and how they define goals. Individuals expressed how they go about setting goals and reaching them. The group identified how important goals are and if they make short term goals to reach long term goals. Patients described how many goals they work on at a time and what affects them not reaching their goal. Individuals described how much time they put into planning and obtaining their goals. The group participated in the intervention "My Goal Board" and made personal goal boards to help them achieve their goal. Clinical Observations/Feedback: Patient came to group and defined goals as accomplishments your trying to work towards. Individual was social with peers and staff while participating in the intervention.  Tracy Dominguez LRT/CTRS         Tracy Dominguez 03/09/2021 1:01 PM

## 2021-03-09 NOTE — Progress Notes (Signed)
Recreation Therapy Notes  INPATIENT RECREATION TR PLAN  Patient Details Name: Selena Robar MRN: [id number] DOB: [birth year] Today's Date: 03/09/2021  Rec Therapy Plan Is patient appropriate for Therapeutic Recreation?: Yes Treatment times per week: at least 3 Estimated Length of Stay: 5-7 days TR Treatment/Interventions: Group participation (Comment)  Discharge Criteria Pt will be discharged from therapy if:: Discharged Treatment plan/goals/alternatives discussed and agreed upon by:: Patient/family  Discharge Summary     Mylo Choi 03/09/2021, 4:31 PM

## 2021-03-09 NOTE — Progress Notes (Signed)
Patient is pleasant and cooperative. She is active on the unit and gets along well with the other patients on the unit.  She reports eating and sleeping well. She denies si/hi/avh anxiety, depression and pain at this encounter.  She is med complaint and received meds without incident. CIWA =0.  Will continue to  monitor with Q15 minute safety checks.   Cleo Butler-Nicholson, LPN

## 2021-03-09 NOTE — [facility] Group Notes (Signed)
[name] Notes:  (Nursing/MHT/Case Management/Adjunct)  Date:  03/09/2021  Time:  8:25 PM  Type of Therapy:  Group Therapy  Participation Level:  Active  Participation Quality:  Appropriate  Affect:  Appropriate  Cognitive:  Alert  Insight:  Good  Engagement in Group:  Engaged and her goal is to not get upset and lay in bed and she want to take better care of herself and be more positive.  Modes of Intervention:  Support  Summary of Progress/Problems:  Tracy Dominguez 03/09/2021, 8:25 PM

## 2021-03-09 NOTE — Progress Notes (Signed)
Recreation Therapy Notes  INPATIENT RECREATION THERAPY ASSESSMENT  Patient Details Name: Makhaila Heaton MRN: [id number] DOB: September 24, 2000 Today's Date: 03/09/2021       Information Obtained From: Patient  Able to Participate in Assessment/Interview: Yes  Patient Presentation: Responsive  Reason for Admission (Per Patient): Active Symptoms, Impulsive Behavior  Patient Stressors:    Coping Skills:   TV, Substance Abuse, Other (Comment) (Sit on the porch)  Leisure Interests (2+):   (Long board, Pictures, Play with cats)  Frequency of Recreation/Participation: Monthly  Awareness of Community Resources:  Yes  Community Resources:  Gym  Current Use: No  If no, Barriers?: Other (Comment) (Pulled a muscle)  Expressed Interest in [place]: Yes  County of Residence:  [place]  Patient Main Form of Transportation: Musician  Patient Strengths:  Loving, caring, good listener  Patient Identified Areas of Improvement:  See a therapist  Patient Goal for Hospitalization:  Be more social.  Current SI (including self-harm):  No  Current HI:  No  Current AVH: No  Staff Intervention Plan: Collaborate with Interdisciplinary Treatment Team, Group Attendance  Consent to Intern Participation: N/A  Eschol Auxier 03/09/2021, 4:30 PM

## 2021-03-09 NOTE — Tx Team (Signed)
Interdisciplinary Treatment and Diagnostic Plan Update  03/09/2021 Time of Session: 9:00AM Tracy Dominguez MRN: [id number]  Principal Diagnosis: <principal problem not specified>  Secondary Diagnoses: Active Problems:   Major depressive disorder, recurrent severe without psychotic features ([place])   Current Medications:  Current Facility-Administered Medications  Medication Dose Route Frequency Provider Last Rate Last Admin   acetaminophen (TYLENOL) tablet 650 mg  650 mg Oral Q6H PRN Patrecia Pour, NP   650 mg at 03/07/21 2043   alum & mag hydroxide-simeth (MAALOX/MYLANTA) 200-200-20 MG/5ML suspension 30 mL  30 mL Oral Q4H PRN Patrecia Pour, NP       clonazePAM Bobbye Charleston) tablet 0.5 mg  0.5 mg Oral Daily PRN Patrecia Pour, NP   0.5 mg at 03/08/21 2104   cyclobenzaprine (FLEXERIL) tablet 5 mg  5 mg Oral BID PRN Rulon Sera, MD   5 mg at 03/08/21 1942   escitalopram (LEXAPRO) tablet 10 mg  10 mg Oral Daily Rulon Sera, MD   10 mg at 03/09/21 7062   LORazepam (ATIVAN) tablet 1-4 mg  1-4 mg Oral Q1H PRN Rulon Sera, MD       Or   LORazepam (ATIVAN) injection 1-4 mg  1-4 mg Intravenous Q1H PRN Rulon Sera, MD       magnesium hydroxide (MILK OF MAGNESIA) suspension 30 mL  30 mL Oral Daily PRN Patrecia Pour, NP       multivitamin with minerals tablet 1 tablet  1 tablet Oral Daily Rulon Sera, MD   1 tablet at 03/09/21 3762   pantoprazole (PROTONIX) EC tablet 40 mg  40 mg Oral Daily Patrecia Pour, NP   40 mg at 03/09/21 8315   traZODone (DESYREL) tablet 50 mg  50 mg Oral QHS PRN Deloria Lair, NP   50 mg at 03/08/21 2104   PTA Medications: Medications Prior to Admission  Medication Sig Dispense Refill Last Dose   amitriptyline (ELAVIL) 25 MG tablet TAKE 1 TABLET BY MOUTH EVERYDAY AT BEDTIME 90 tablet 1    clonazePAM (KLONOPIN) 0.5 MG tablet TAKE 1/2 TO 1 TABLETS (0.25-0.5 MG TOTAL) BY MOUTH 2 (TWO) TIMES DAILY AS NEEDED FOR ANXIETY. 60 tablet 0    Dexlansoprazole 30 MG capsule  Take 1 capsule (30 mg total) by mouth daily. 30 capsule 3    etonogestrel (NEXPLANON) 68 MG IMPL implant 1 each by Subdermal route once.      meloxicam (MOBIC) 7.5 MG tablet TAKE 1 TABLET (7.5 MG TOTAL) BY MOUTH DAILY. WITH FOOD 30 tablet 0    ondansetron (ZOFRAN-ODT) 4 MG disintegrating tablet Take 4 mg by mouth every 8 (eight) hours as needed.      pantoprazole (PROTONIX) 20 MG tablet Take 1 tablet (20 mg total) by mouth daily. 30 tablet 1     Patient Stressors: Marital or family conflict Occupational concerns  Patient Strengths: Ability for insight Motivation for treatment/growth  Treatment Modalities: Medication Management, Group therapy, Case management,  1 to 1 session with clinician, Psychoeducation, Recreational therapy.   Physician Treatment Plan for Primary Diagnosis: <principal problem not specified> Long Term Goal(s): Improvement in symptoms so as ready for discharge   Short Term Goals: Ability to verbalize feelings will improve Ability to disclose and discuss suicidal ideas Ability to identify changes in lifestyle to reduce recurrence of condition will improve  Medication Management: Evaluate patient's response, side effects, and tolerance of medication regimen.  Therapeutic Interventions: 1 to 1 sessions, Unit Group sessions and Medication administration.  Evaluation of  Outcomes: Not Met  Physician Treatment Plan for Secondary Diagnosis: Active Problems:   Major depressive disorder, recurrent severe without psychotic features ([employer])  Long Term Goal(s): Improvement in symptoms so as ready for discharge   Short Term Goals: Ability to verbalize feelings will improve Ability to disclose and discuss suicidal ideas Ability to identify changes in lifestyle to reduce recurrence of condition will improve     Medication Management: Evaluate patient's response, side effects, and tolerance of medication regimen.  Therapeutic Interventions: 1 to 1 sessions, Unit Group  sessions and Medication administration.  Evaluation of Outcomes: Not Met   RN Treatment Plan for Primary Diagnosis: <principal problem not specified> Long Term Goal(s): Knowledge of disease and therapeutic regimen to maintain health will improve  Short Term Goals: Ability to remain free from injury will improve, Ability to demonstrate self-control, Ability to verbalize feelings will improve, Ability to disclose and discuss suicidal ideas, Ability to identify and develop effective coping behaviors will improve, and Compliance with prescribed medications will improve  Medication Management: RN will administer medications as ordered by provider, will assess and evaluate patient's response and provide education to patient for prescribed medication. RN will report any adverse and/or side effects to prescribing provider.  Therapeutic Interventions: 1 on 1 counseling sessions, Psychoeducation, Medication administration, Evaluate responses to treatment, Monitor vital signs and CBGs as ordered, Perform/monitor CIWA, COWS, AIMS and Fall Risk screenings as ordered, Perform wound care treatments as ordered.  Evaluation of Outcomes: Not Met   LCSW Treatment Plan for Primary Diagnosis: <principal problem not specified> Long Term Goal(s): Safe transition to appropriate next level of care at discharge, Engage patient in therapeutic group addressing interpersonal concerns.  Short Term Goals: Engage patient in aftercare planning with referrals and resources, Increase social support, Increase ability to appropriately verbalize feelings, Increase emotional regulation, Facilitate patient progression through stages of change regarding substance use diagnoses and concerns, Identify triggers associated with mental health/substance abuse issues, and Increase skills for wellness and recovery  Therapeutic Interventions: Assess for all discharge needs, 1 to 1 time with Social worker, Explore available resources and  support systems, Assess for adequacy in community support network, Educate family and significant other(s) on suicide prevention, Complete Psychosocial Assessment, Interpersonal group therapy.  Evaluation of Outcomes: Not Met   Progress in Treatment: Attending groups: Yes. Participating in groups: Yes. Taking medication as prescribed: Yes. Toleration medication: Yes. Family/Significant other contact made: Yes, individual(s) contacted:  Ebony Hail Ibanez/mother. Patient understands diagnosis: Yes. Discussing patient identified problems/goals with staff: Yes. Medical problems stabilized or resolved: Yes. Denies suicidal/homicidal ideation: Yes. Issues/concerns per patient self-inventory: No. Other: none.  New problem(s) identified: No, Describe:  none.  New Short Term/Long Term Goal(s): detox, elimination of symptoms of psychosis, medication management for mood stabilization; elimination of SI thoughts; development of comprehensive mental wellness/sobriety plan.  Patient Goals: "Being more social and looking back, reflecting on what I did." She also mentions needing a therapist and treatment for substance use.   Discharge Plan or Barriers: CSW will assist pt with development of an appropriate aftercare/discharge plan including outpatient substance use treatment.   Reason for Continuation of Hospitalization: Depression Medication stabilization Suicidal ideation  Estimated Length of Stay: 1-7 days  Attendees: Patient: Tracy Dominguez 03/09/2021 9:00 AM  Physician: Selina Cooley, MD 03/09/2021 9:00 AM  Nursing: Marla Roe, RN 03/09/2021 9:00 AM  RN Care Manager: 03/09/2021 9:00 AM  Social Worker: Chalmers Guest. Guerry Bruin, MSW, LCSW, [place] 03/09/2021 9:00 AM  Recreational Therapist: Devin Going, LRT  03/09/2021  9:00 AM  Other: Kiva Martinique, MSW, LCSW-A 03/09/2021 9:00 AM  Other: Assunta Curtis, MSW, LCSW 03/09/2021 9:00 AM  Other: Waldon Merl, NP 03/09/2021 9:00 AM    Scribe for Treatment  Team: Shirl Harris, LCSW 03/09/2021 9:00 AM

## 2021-03-10 DIAGNOSIS — F332 Major depressive disorder, recurrent severe without psychotic features: Secondary | ICD-10-CM | POA: Diagnosis not present

## 2021-03-10 NOTE — Progress Notes (Signed)
Patient ID: Tracy Dominguez, female   DOB: Nov 03, 2000, 20 y.o.   MRN: [id number] [facility] MD Progress Note  03/10/2021 4:15 PM Tracy Dominguez  MRN:  [id number]  Tracy Dominguez is a 20 year old female who was brought into the emergency room via EMS on 03/15/2021 after ingesting 36 tablets of ibuprofen and drinking alcohol.  Patient states that she told her mother after she had done this because she got scared and did not want to die.  UDS was positive for cannabis.  Blood alcohol level was 239.  Patient has history of similar presentation at [facility] about a month ago after drinking and endorsing suicidal ideations.  CC: " I do not think I am an alcoholic, but I think it would be good for me to go to a group about substance abuse." Subjective: Patient was approached as she was watching TV in the day room.  She appears bright and talks freely in a friendly voice.  She states that this hospitalization has been more helpful than she thought it would be.  She said it was like a "wake-up call."  Writer expressed to patient that people can often minimize the effect that alcohol or other substances have on their lives and this is important to explore.  Patient acknowledged that she uses alcohol as a coping mechanism for depression and anxiety.  She expresses desire to get connected with therapy and continue medication. Patient denies paranoia, auditory or visual hallucinations.  She denies suicidal or homicidal ideations.  She continues to say "I do not know why I took those pills."  Encouraged patient that she can explore these points in therapy as she learns more about herself and effective coping skills.  Patient is eating and sleeping adequately.  She said she took a shower this morning and that felt really nice.  She is neat, attending to ADLs.  She is attending and participating in group activities as well as interacting with some peers.  No unsafe behaviors noted.  Principal Problem: Major depressive  disorder, recurrent severe without psychotic features ([place]) Diagnosis: Principal Problem:   Major depressive disorder, recurrent severe without psychotic features ([place]) Active Problems:   Gastroesophageal reflux disease without esophagitis   Generalized anxiety disorder   Alcohol use disorder, moderate, dependence ([place])  Total Time spent with patient: 20 minutes  Past Psychiatric History: Patient seen at [facility] in June 2022 for alcohol intoxication.  She states she has never been to a therapist.  She has been given trials of Lexapro and Prozac in the past, but did not take them.  Patient willing to take Lexapro and go to therapy  Past Medical History:  Past Medical History:  Diagnosis Date   Anxiety    Depression    GERD (gastroesophageal reflux disease)    History reviewed. No pertinent surgical history. Family History:  Family History  Problem Relation Age of Onset   Depression Mother    Anxiety disorder Mother    Obesity Mother    Bipolar disorder Brother    Liver disease Maternal Grandmother    Kidney disease Maternal Grandmother    Breast cancer Maternal Grandmother    Kidney disease Paternal Grandmother    Hypertension Paternal Grandfather    Healthy Brother    Family Psychiatric  History:  Mother has depression; on Lexapro Social History:  Social History   Substance and Sexual Activity  Alcohol Use Yes   Comment: occ     Social History   Substance and Sexual Activity  Drug Use Never    Social History   Socioeconomic History   Marital status: Single    Spouse name: Not on file   Number of children: Not on file   Years of education: Not on file   Highest education level: Not on file  Occupational History   Occupation: student  Tobacco Use   Smoking status: Never   Smokeless tobacco: Never  Vaping Use   Vaping Use: Every day  Substance and Sexual Activity   Alcohol use: Yes    Comment: occ   Drug use: Never   Sexual activity: Yes    Birth  control/protection: Implant  Other Topics Concern   Not on file  Social History Narrative   Not on file   Social Determinants of Health   Financial Resource Strain: Not on file  Food Insecurity: Not on file  Transportation Needs: Not on file  Physical Activity: Not on file  Stress: Not on file  Social Connections: Not on file   Additional Social History:      Sleep: Fair  Appetite:  Good  Current Medications: Current Facility-Administered Medications  Medication Dose Route Frequency Provider Last Rate Last Admin   acetaminophen (TYLENOL) tablet 650 mg  650 mg Oral Q6H PRN Patrecia Pour, NP   650 mg at 03/07/21 2043   alum & mag hydroxide-simeth (MAALOX/MYLANTA) 200-200-20 MG/5ML suspension 30 mL  30 mL Oral Q4H PRN Patrecia Pour, NP       clonazePAM Bobbye Charleston) tablet 0.5 mg  0.5 mg Oral BID PRN Salley Scarlet, MD   0.5 mg at 03/10/21 1116   cyclobenzaprine (FLEXERIL) tablet 5 mg  5 mg Oral BID PRN Rulon Sera, MD   5 mg at 03/10/21 1431   escitalopram (LEXAPRO) tablet 10 mg  10 mg Oral Daily Rulon Sera, MD   10 mg at 03/10/21 0805   magnesium hydroxide (MILK OF MAGNESIA) suspension 30 mL  30 mL Oral Daily PRN Patrecia Pour, NP       multivitamin with minerals tablet 1 tablet  1 tablet Oral Daily Rulon Sera, MD   1 tablet at 03/10/21 0805   pantoprazole (PROTONIX) EC tablet 40 mg  40 mg Oral Daily Patrecia Pour, NP   40 mg at 03/10/21 0805   traZODone (DESYREL) tablet 50 mg  50 mg Oral QHS PRN Deloria Lair, NP   50 mg at 03/09/21 2112    Lab Results:  No results found for this or any previous visit (from the past 48 hour(s)).   Blood Alcohol level:  Lab Results  Component Value Date   ETH 239 (H) 03/07/2021   ETH 131 (H) [id number]    Metabolic Disorder Labs: No results found for: HGBA1C, MPG No results found for: PROLACTIN No results found for: CHOL, TRIG, HDL, CHOLHDL, VLDL, LDLCALC  Physical Findings: AIMS:  , ,  ,  ,    CIWA:  CIWA-Ar Total:  0 COWS:     Musculoskeletal: Strength & Muscle Tone: within normal limits Gait & Station: normal Patient leans: N/A  Psychiatric Specialty Exam:  Presentation  General Appearance:  Appropriate for Environment Eye Contact: Good Speech: Clear and Coherent Speech Volume: Normal Handedness: No data recorded  Mood and Affect  Mood: Euthymic Affect: Appropriate; Congruent  Thought Process  Thought Processes: Coherent Descriptions of Associations:Intact Orientation:Full (Time, Place and Person) Thought Content:WDL; Logical History of Schizophrenia/Schizoaffective disorder:No  Duration of Psychotic Symptoms:No data recorded Hallucinations:Hallucinations: None (Denies) Ideas of Reference:None (  Denies) Suicidal Thoughts:Suicidal Thoughts: No (Denies) Homicidal Thoughts:Homicidal Thoughts: No (Denies)  Sensorium  Memory: Immediate Good; Remote Good; Recent Good Judgment: Intact Insight: Fair  Community education officer  Concentration: Good Attention Span: Good Recall: Good Fund of Knowledge: Good Language: Good  Psychomotor Activity  Psychomotor Activity: Psychomotor Activity: Normal  Assets  Assets: Communication Skills; Desire for Improvement; Financial Resources/Insurance; Housing; Leisure Time; Physical Health; Resilience; Social Support; Vocational/Educational  Sleep  Sleep: Sleep: Good Number of Hours of Sleep: 6.25   Physical Exam: Physical Exam Vitals and nursing note reviewed.  HENT:     Head: Normocephalic.     Nose: No congestion or rhinorrhea.  Eyes:     General:        Right eye: No discharge.        Left eye: No discharge.  Cardiovascular:     Rate and Rhythm: Normal rate.  Pulmonary:     Effort: Pulmonary effort is normal.  Musculoskeletal:        General: Normal range of motion.     Cervical back: Normal range of motion.  Skin:    General: Skin is dry.  Neurological:     Mental Status: She is alert and oriented to person,  place, and time.  Psychiatric:        Attention and Perception: Attention normal.        Mood and Affect: Mood is depressed.        Speech: Speech normal.        Behavior: Behavior is cooperative.        Thought Content: Thought content normal.   Review of Systems  Psychiatric/Behavioral:  Positive for depression (Improving) and substance abuse (Hx of alcohol misuse). Negative for hallucinations, memory loss and suicidal ideas. The patient is not nervous/anxious and does not have insomnia.   All other systems reviewed and are negative. Blood pressure 126/65, pulse 87, temperature 98.2 F (36.8 C), temperature source Oral, resp. rate 17, height '5\' 5"'$  (1.651 m), weight 56.2 kg, SpO2 100 %. Body mass index is 20.63 kg/m.   Treatment Plan Summary: Daily contact with patient to assess and evaluate symptoms and progress in treatment and Medication management.  03/10/2021 Update: Patient much more engaged in milieu activities and interaction with peers and staff.  Patient expresses more insight into her alcohol use and its relation to anxiety and depression.  Patient expresses commitment to outpatient therapy and medication management.  Plan is to discharge tomorrow.  Discontinue CIWA protocol.  Patient has been hospitalized for more than 72 hours.  Last recorded CIWA score was 5 on 03/07/2021 at 2025.   Anxiety - Continue clonazepam 0.5 mg oral daily as needed  Muscle spasms - Continue Flexeril 5 mg oral 2 times daily as needed  Depression - Continue Lexapro 10 mg oral daily  Supplementation - Continue multivitamin with minerals tablet p.o. daily  GERD - Continue Protonix EC tablet 40 mg oral daily  PRN, Other  --Continue Tylenol 650 mg po every 6 hrs prn pain --Continue MAALOX/MYLANTA 30 mL po every 4 hrs prn indigestion --Continue Milk of Magnesia 30 mL po daily prn constipation    -Continue trazodone 50 mg tablet oral at bedtime as needed for sleep  Sherlon Handing,  NP 03/10/2021, 4:15 PM

## 2021-03-10 NOTE — Progress Notes (Signed)
Recreation Therapy Notes   Date: 03/10/2021  Time: 10:30 am   Location: Craft room   Behavioral response: Appropriate  Intervention Topic: Coping skills    Discussion/Intervention:  Group content on today was focused on coping skills. The group defined what coping skills are and when they normally use coping skills. Individuals described how they normally cope with thing and the coping skills they normally use. Patients expressed why it is important to cope with things and how not coping with things can affect you. The group participated in the intervention "My coping box" and made coping boxes while adding coping skills they could use in the future to the box. Clinical Observations/Feedback: Patient came to group and defined coping skills as things you do to change the mood you are in. She identified her coping skills as hot showers, long board, watching television and playing with her cats. Individual was social with peers and staff while participating in the intervention.  Samreen Seltzer LRT/CTRS      Imaad Reuss 03/10/2021 12:51 PM        Jimmie Dattilio 03/10/2021 12:51 PM

## 2021-03-10 NOTE — Progress Notes (Signed)
Spoke with Jennette Kettle at [phone]. Update provided on progress in hospital, and plan of care. She notes that she and her daughter are very close.She feels comfortable with patient returning home tomorrow. She confirms there are no firearms in the house. She keeps all medications locked up in a box that only her stepfather has access to to administer medications. She is agreeable to outpatient follow-up. She had no other questions or concerns.

## 2021-03-10 NOTE — Progress Notes (Signed)
Patient alert and oriented x 4.  Affect is pleasant and calm. Observed appropriate interactions with staff and peers. Denies anxiety, SI or HI.  Will continue q15 minute rounding for safety checks per unit protocol.

## 2021-03-10 NOTE — [facility] Group Notes (Signed)
LCSW Group Therapy Note  03/10/2021 2:11 PM  Type of Therapy/Topic:  Group Therapy:  Feelings about Diagnosis  Participation Level:  Active   Description of Group:   This group will allow patients to explore their thoughts and feelings about diagnoses they have received. Patients will be guided to explore their level of understanding and acceptance of these diagnoses. Facilitator will encourage patients to process their thoughts and feelings about the reactions of others to their diagnosis and will guide patients in identifying ways to discuss their diagnosis with significant others in their lives. This group will be process-oriented, with patients participating in exploration of their own experiences, giving and receiving support, and processing challenge from other group members.   Therapeutic Goals: Patient will demonstrate understanding of diagnosis as evidenced by identifying two or more symptoms of the disorder Patient will be able to express two feelings regarding the diagnosis Patient will demonstrate their ability to communicate their needs through discussion and/or role play  Summary of Patient Progress: Patient was an active participant in group.  Patient shared that her family has been supportive and "good" in regards to her mental health. She reports that she struggled with anxiety and depression.  She reports that as a coping mechanisms she uses longboarding, exercise, time with her cats, watching her favorite show or taking a hot shower.  Patient was attentive and supportive of other group members.  Patient was able to follow along with discussion and contribute appropriately.    Therapeutic Modalities:   Cognitive Behavioral Therapy Brief Therapy Feelings Identification   Assunta Curtis, MSW, LCSW 03/10/2021 2:11 PM

## 2021-03-10 NOTE — Progress Notes (Signed)
Patient alert and oriented x 4, affect is flat but brightens upon approach, no distress noted, she denies SI/HI/AVH, on CIWA no distress or withdrawal noted, 15 minutes safety checks maintained will continue to monitor

## 2021-03-11 DIAGNOSIS — F332 Major depressive disorder, recurrent severe without psychotic features: Principal | ICD-10-CM

## 2021-03-11 MED ORDER — CLONAZEPAM 0.5 MG PO TABS: 0.5000 mg | ORAL_TABLET | Freq: Two times a day (BID) | ORAL | 1 refills | Status: AC | PRN

## 2021-03-11 MED ORDER — ESCITALOPRAM OXALATE 10 MG PO TABS: 10.0000 mg | ORAL_TABLET | Freq: Every day | ORAL | 1 refills | Status: AC

## 2021-03-11 NOTE — Progress Notes (Signed)
D: Pt alert and oriented. Pt rates depression 0/10, hopelessness 0/10, and anxiety 0/10. Pt goal: "To go home and start my journey of self love and to use coping skills." Pt reports energy level as normal and concentration as being good. Pt reports sleep last night as being good. Pt did receive medications for sleep and did find them helpful. Pt denies experiencing any pain at this time. Pt denies experiencing any SI/HI, or AVH at this time.   Pt is calm and cooperative and excited for discharge.  A: Scheduled medications administered to pt, per MD orders. Support and encouragement provided. Frequent verbal contact made. Routine safety checks conducted q15 minutes.   R: No adverse drug reactions noted. Pt verbally contracts for safety at this time. Pt complaint with medications and treatment plan. Pt interacts well with others on the unit. Pt remains safe at this time. Will continue to monitor.

## 2021-03-11 NOTE — BHH Suicide Risk Assessment (Signed)
[facility] Discharge Suicide Risk Assessment   Principal Problem: Major depressive disorder, recurrent severe without psychotic features ([place]) Discharge Diagnoses: Principal Problem:   Major depressive disorder, recurrent severe without psychotic features ([place]) Active Problems:   Gastroesophageal reflux disease without esophagitis   Generalized anxiety disorder   Alcohol use disorder, moderate, dependence ([place])   Total Time spent with patient: 35 minutes- 25 minutes face-to-face contact with patient, 10 minutes documentation, coordination of care, scripts   Musculoskeletal: Strength & Muscle Tone: within normal limits Gait & Station: normal Patient leans: N/A  Psychiatric Specialty Exam  Presentation  General Appearance: Appropriate for Environment; Casual  Eye Contact:Good  Speech:Clear and Coherent; Normal Rate  Speech Volume:Normal  Handedness:Right   Mood and Affect  Mood:Euthymic  Duration of Depression Symptoms: Less than two weeks  Affect:Congruent   Thought Process  Thought Processes:Coherent; Goal Directed  Descriptions of Associations:Intact  Orientation:Full (Time, Place and Person)  Thought Content:Logical  History of Schizophrenia/Schizoaffective disorder:No  Duration of Psychotic Symptoms:No data recorded Hallucinations:Hallucinations: None  Ideas of Reference:None  Suicidal Thoughts:Suicidal Thoughts: No  Homicidal Thoughts:Homicidal Thoughts: No   Sensorium  Memory:Immediate Good; Recent Good; Remote Good  Judgment:Intact  Insight:Fair   Executive Functions  Concentration:Good  Attention Span:Good  [place] of Knowledge:Good  Language:Good   Psychomotor Activity  Psychomotor Activity:Psychomotor Activity: Normal   Assets  Assets:Communication Skills; Desire for Improvement; Financial Resources/Insurance; Housing; Leisure Time; Physical Health; Resilience; Social Support; Talents/Skills; Transportation;  Vocational/Educational   Sleep  Sleep:Sleep: Fair Number of Hours of Sleep: 6.25   Physical Exam: Physical Exam ROS Blood pressure 122/71, pulse (!) 116, temperature 98.1 F (36.7 C), temperature source Oral, resp. rate 17, height '5\' 5"'$  (1.651 m), weight 56.2 kg, SpO2 99 %. Body mass index is 20.63 kg/m.  Mental Status Per Nursing Assessment::   On Admission:  Plan includes specific time, place, or method  Demographic Factors:  Adolescent or young adult  Loss Factors: NA  Historical Factors: Prior suicide attempts, Family history of suicide, and Family history of mental illness or substance abuse  Risk Reduction Factors:   Sense of responsibility to family, Living with another person, especially a relative, Positive social support, Positive therapeutic relationship, and Positive coping skills or problem solving skills  Continued Clinical Symptoms:  Depression:   Recent sense of peace/wellbeing Alcohol/Substance Abuse/Dependencies  Cognitive Features That Contribute To Risk:  None    Suicide Risk:  Mild:  Suicidal ideation of limited frequency, intensity, duration, and specificity.  There are no identifiable plans, no associated intent, mild dysphoria and related symptoms, good self-control (both objective and subjective assessment), few other risk factors, and identifiable protective factors, including available and accessible social support.    Plan Of Care/Follow-up recommendations:  Activity:  as tolerated Diet:  regular diet  Salley Scarlet, MD 03/11/2021, 9:30 AM

## 2021-03-11 NOTE — Progress Notes (Signed)
Patient has been pleasant and cooperative. Voices no complaints

## 2021-03-11 NOTE — Progress Notes (Signed)
  [facility] Adult Case Management Discharge Plan :  Will you be returning to the same living situation after discharge:  Yes,  pt's home At discharge, do you have transportation home?: Yes,  pt's mother will provide transportation Do you have the ability to pay for your medications: Yes,  BCBS insurance  Release of information consent forms completed and in the chart;  Patient's signature needed at discharge.  Patient to Follow up at:  Follow-up Information     [facility] Follow up on 03/13/2021.   Why: You have a hospital follow-up scheduled for Friday August 26th at 12:30pm. (in-person). They will contact you to remind you of your scheduled appointment. Contact information: [place] [address] [phone]                 Next level of care provider has access to [place] and Suicide Prevention discussed: Yes,  completed with pt's mother     Has patient been referred to the Quitline?: Patient refused referral  Patient has been referred for addiction treatment: Pt. refused referral  Deshon Koslowski [place], [place] 03/11/2021, 9:52 AM

## 2021-03-11 NOTE — Discharge Summary (Signed)
Physician Discharge Summary Note  Patient:  Tracy Dominguez is an 20 y.o., female MRN:  [id number] DOB:  01-23-2001 Patient phone:  [phone] (home)  Patient address:   [address] [place] [address],  Total Time spent with patient: 35 minutes- 25 minutes face-to-face contact with patient, 10 minutes documentation, coordination of care, scripts   Date of Admission:  03/07/2021 Date of Discharge: 03/11/2021  Reason for Admission:  She was brought to the emergency via EMS following a intentional overdose on 36 tablets of ibuprofen on March 07, 2021. Poison control was consult salicylate and acetaminophen levels were unremarkable; other labs labs were wnl. UDS was positive for cannabis and her blood alcohol level was 239.  Principal Problem: Major depressive disorder, recurrent severe without psychotic features [facility]) Discharge Diagnoses: Principal Problem:   Major depressive disorder, recurrent severe without psychotic features ([place]) Active Problems:   Gastroesophageal reflux disease without esophagitis   Generalized anxiety disorder   Alcohol use disorder, moderate, dependence ([place])   Past Psychiatric History: Patient seen at [facility] in June 2022 for alcohol intoxication.  She states she has never been to a therapist.  She has been given trials of Lexapro and Prozac in the past, but did not take them.  Past Medical History:  Past Medical History:  Diagnosis Date   Anxiety    Depression    GERD (gastroesophageal reflux disease)    History reviewed. No pertinent surgical history. Family History:  Family History  Problem Relation Age of Onset   Depression Mother    Anxiety disorder Mother    Obesity Mother    Bipolar disorder Brother    Liver disease Maternal Grandmother    Kidney disease Maternal Grandmother    Breast cancer Maternal Grandmother    Kidney disease Paternal Grandmother    Hypertension Paternal Grandfather    Healthy Brother    Family Psychiatric   History: Mother has depression; on Lexapro, brother with bipolar disorder Social History:  Social History   Substance and Sexual Activity  Alcohol Use Yes   Comment: occ     Social History   Substance and Sexual Activity  Drug Use Never    Social History   Socioeconomic History   Marital status: Single    Spouse name: Not on file   Number of children: Not on file   Years of education: Not on file   Highest education level: Not on file  Occupational History   Occupation: student  Tobacco Use   Smoking status: Never   Smokeless tobacco: Never  Vaping Use   Vaping Use: Every day  Substance and Sexual Activity   Alcohol use: Yes    Comment: occ   Drug use: Never   Sexual activity: Yes    Birth control/protection: Implant  Other Topics Concern   Not on file  Social History Narrative   Not on file   Social Determinants of Health   Financial Resource Strain: Not on file  Food Insecurity: Not on file  Transportation Needs: Not on file  Physical Activity: Not on file  Stress: Not on file  Social Connections: Not on file    Hospital Course:  Patient presented after suicide attempt via intentional overdose. She was cleared by poison control, and admitted to our unit. She was started on Lexparo 10 mg daily and tolerated well. She attended groups, and remained in contact with her family. She did not require any restraints or seclusion while on the unit. Mother contacted prior to  discharge, and she felt safe with patient returning home. Mother confirmed there were no guns in the household, and that all medications were kept in a lockbox that only patient's stepfather has the key for. Patient denies suicidal ideations, homicidal ideations, visual hallucinations, and auditory hallucinations. She is agreeable to continuing medications and attending outpatient psychiatric services.   Physical Findings: AIMS:  , ,  ,  ,    CIWA:  CIWA-Ar Total: 0 COWS:      Musculoskeletal: Strength & Muscle Tone: within normal limits Gait & Station: normal Patient leans: N/A   Psychiatric Specialty Exam:  Presentation  General Appearance: Appropriate for Environment; Casual  Eye Contact:Good  Speech:Clear and Coherent; Normal Rate  Speech Volume:Normal  Handedness:Right   Mood and Affect  Mood:Euthymic  Affect:Congruent   Thought Process  Thought Processes:Coherent; Goal Directed  Descriptions of Associations:Intact  Orientation:Full (Time, Place and Person)  Thought Content:Logical  History of Schizophrenia/Schizoaffective disorder:No  Duration of Psychotic Symptoms:No data recorded Hallucinations:Hallucinations: None  Ideas of Reference:None  Suicidal Thoughts:Suicidal Thoughts: No  Homicidal Thoughts:Homicidal Thoughts: No   Sensorium  Memory:Immediate Good; Recent Good; Remote Good  Judgment:Intact  Insight:Fair   Executive Functions  Concentration:Good  Attention Span:Good  [place] of Knowledge:Good  Language:Good   Psychomotor Activity  Psychomotor Activity:Psychomotor Activity: Normal   Assets  Assets:Communication Skills; Desire for Improvement; Financial Resources/Insurance; Housing; Leisure Time; Physical Health; Resilience; Social Support; Talents/Skills; Transportation; Vocational/Educational   Sleep  Sleep:Sleep: Fair Number of Hours of Sleep: 6.25    Physical Exam: Physical Exam Vitals and nursing note reviewed.  Constitutional:      Appearance: Normal appearance.  HENT:     Head: Normocephalic and atraumatic.     Right Ear: External ear normal.     Left Ear: External ear normal.     Nose: Nose normal.     Mouth/Throat:     Mouth: Mucous membranes are moist.     Pharynx: Oropharynx is clear.  Eyes:     Extraocular Movements: Extraocular movements intact.     Pupils: Pupils are equal, round, and reactive to light.  Cardiovascular:     Rate and Rhythm: Normal  rate.     Pulses: Normal pulses.  Pulmonary:     Effort: Pulmonary effort is normal.     Breath sounds: Normal breath sounds.  Abdominal:     General: Abdomen is flat.     Palpations: Abdomen is soft.  Musculoskeletal:        General: No swelling. Normal range of motion.     Cervical back: Normal range of motion and neck supple.  Skin:    General: Skin is warm and dry.  Neurological:     General: No focal deficit present.     Mental Status: She is alert and oriented to person, place, and time.  Psychiatric:        Mood and Affect: Mood normal.        Behavior: Behavior normal.        Thought Content: Thought content normal.        Judgment: Judgment normal.   Review of Systems  Constitutional: Negative.   HENT: Negative.    Eyes: Negative.   Respiratory: Negative.    Cardiovascular: Negative.   Gastrointestinal: Negative.   Genitourinary: Negative.   Musculoskeletal:  Positive for back pain and myalgias.  Skin: Negative.   Neurological: Negative.   Endo/Heme/Allergies: Negative.   Psychiatric/Behavioral:  Negative for hallucinations, memory loss and suicidal ideas. The  patient does not have insomnia.   Blood pressure 122/71, pulse (!) 116, temperature 98.1 F (36.7 C), temperature source Oral, resp. rate 17, height '5\' 5"'$  (1.651 m), weight 56.2 kg, SpO2 99 %. Body mass index is 20.63 kg/m.   Social History   Tobacco Use  Smoking Status Never  Smokeless Tobacco Never   Tobacco Cessation:  N/A, patient does not currently use tobacco products   Blood Alcohol level:  Lab Results  Component Value Date   ETH 239 (H) 03/07/2021   ETH 131 (H) [id number]    Metabolic Disorder Labs:  No results found for: HGBA1C, MPG No results found for: PROLACTIN No results found for: CHOL, TRIG, HDL, CHOLHDL, VLDL, LDLCALC  See Psychiatric Specialty Exam and Suicide Risk Assessment completed by Attending Physician prior to discharge.  Discharge destination:  Home  Is patient  on multiple antipsychotic therapies at discharge:  No   Has Patient had three or more failed trials of antipsychotic monotherapy by history:  No  Recommended Plan for Multiple Antipsychotic Therapies: NA  Discharge Instructions     Diet general   Complete by: As directed    Increase activity slowly   Complete by: As directed       Allergies as of 03/11/2021   No Known Allergies      Medication List     STOP taking these medications    amitriptyline 25 MG tablet Commonly known as: ELAVIL   ondansetron 4 MG disintegrating tablet Commonly known as: ZOFRAN-ODT   pantoprazole 20 MG tablet Commonly known as: Protonix       TAKE these medications      Indication  clonazePAM 0.5 MG tablet Commonly known as: KLONOPIN Take 1 tablet (0.5 mg total) by mouth 2 (two) times daily as needed (anxiety). What changed: See the new instructions.  Indication: Feeling Anxious   Dexlansoprazole 30 MG capsule Take 1 capsule (30 mg total) by mouth daily.  Indication: Gastroesophageal Reflux Disease   escitalopram 10 MG tablet Commonly known as: LEXAPRO Take 1 tablet (10 mg total) by mouth daily. Start taking on: March 12, 2021  Indication: Major Depressive Disorder   etonogestrel 68 MG Impl implant Commonly known as: NEXPLANON 1 each by Subdermal route once.  Indication: Birth Control Treatment   meloxicam 7.5 MG tablet Commonly known as: MOBIC TAKE 1 TABLET (7.5 MG TOTAL) BY MOUTH DAILY. WITH FOOD  Indication: Joint Damage causing Pain and Loss of Function         Follow-up recommendations:  Activity:  as tolerated Diet:  regular diet  Comments:  30-day scripts with 1 refill sent to [facility] in [place], [place] per patient request.   Signed: Salley Scarlet, MD 03/11/2021, 9:31 AM

## 2021-03-11 NOTE — Progress Notes (Signed)
D: Pt alert and oriented. Pt denies experiencing any pain, SI/HI, or AVH at this time. Pt reports she will be able to keep herself safe when she returns home.   A: Pt received discharge and medication education/information. Pt belongings were returned and signed for at this time.   R: Pt verbalized understanding of discharge and medication education/information.  Pt escorted by staff to medical mall front lobby where pt's mother picked her up.

## 2021-03-11 NOTE — Plan of Care (Signed)
  Problem: Education: Goal: Knowledge of [facility] Education information/materials will improve Outcome: Progressing Goal: Emotional status will improve Outcome: Progressing Goal: Mental status will improve Outcome: Progressing Goal: Verbalization of understanding the information provided will improve Outcome: Progressing   Problem: Safety: Goal: Periods of time without injury will increase Outcome: Progressing   Problem: Education: Goal: Utilization of techniques to improve thought processes will improve Outcome: Progressing Goal: Knowledge of the prescribed therapeutic regimen will improve Outcome: Progressing   Problem: Safety: Goal: Ability to disclose and discuss suicidal ideas will improve Outcome: Progressing Goal: Ability to identify and utilize support systems that promote safety will improve Outcome: Progressing

## 2021-03-19 ENCOUNTER — Telehealth: Payer: Self-pay

## 2021-03-19 NOTE — Telephone Encounter (Signed)
Copied from [place] [id number]. Topic: Medical Record Request - Patient ROI Request >> Mar 19, 2021 12:59 PM Valere Dross wrote: Patient Name/DOB/MRN #: Irys Sahm/04/22/2001  Requestor Name/Agency: Ebony Hail [place]umen[place]-mother  Call Back #: [phone]  Information Requested: All office visit records   Route to [facility] for [place] clinics. For all other clinics, route to the clinic's PEC Pool.

## 2021-03-19 NOTE — Telephone Encounter (Signed)
Spoke with pt's mom Ebony Hail. Advised that mom could come to our office and sign a ROI or she could sign a release at pt's new providers office and they could send [place] the request. Mom stated that she would sign request at new office and have them request pt's records.

## 2021-03-19 NOTE — Telephone Encounter (Signed)
Copied from [place] [id number]. Topic: Medical Record Request - Patient ROI Request >> Mar 19, 2021 12:59 PM Valere Dross wrote: Patient Name/DOB/MRN #: Tracy Dominguez/17-Feb-2001/[id number]  Requestor Name/Agency: Ebony Hail [place]-mother  Call Back #: [phone]  Information Requested: All office visit records   Route to [facility] for [place] clinics. For all other clinics, route to the clinic's PEC Pool.

## 2021-03-20 ENCOUNTER — Telehealth: Payer: Self-pay

## 2021-03-20 NOTE — Telephone Encounter (Signed)
Copied from [place] [phone]. Topic: Medical Record Request - Patient ROI Request >> Mar 19, 2021 12:59 PM Valere Dross wrote: Patient Name/DOB/MRN #: Tracy Dominguez[birth year]  Requestor Name/Agency: Ebony Hail [place]-mother  Call Back #: [phone]  Information Requested: All office visit records   Route to [facility] for [place] clinics. For all other clinics, route to the clinic's PEC Pool.

## 2021-05-19 ENCOUNTER — Other Ambulatory Visit: Payer: Self-pay | Admitting: Physician Assistant

## 2021-05-19 DIAGNOSIS — K219 Gastro-esophageal reflux disease without esophagitis: Secondary | ICD-10-CM

## 2021-09-01 ENCOUNTER — Ambulatory Visit: Payer: BC Managed Care – PPO | Admitting: Family Medicine

## 2021-09-01 NOTE — Progress Notes (Unsigned)
°  ° ° °  Established patient visit   Patient: Tracy Dominguez   DOB: October 24, 2000   21 y.o. Female  MRN: [id number] Visit Date: 09/01/2021  Today's healthcare provider: Myles Gip, DO   No chief complaint on file.  Subjective    HPI  ***  Medications: Outpatient Medications Prior to Visit  Medication Sig   clonazePAM (KLONOPIN) 0.5 MG tablet Take 1 tablet (0.5 mg total) by mouth 2 (two) times daily as needed (anxiety).   Dexlansoprazole 30 MG capsule Take 1 capsule (30 mg total) by mouth daily.   escitalopram (LEXAPRO) 10 MG tablet Take 1 tablet (10 mg total) by mouth daily.   etonogestrel (NEXPLANON) 68 MG IMPL implant 1 each by Subdermal route once.   meloxicam (MOBIC) 7.5 MG tablet TAKE 1 TABLET (7.5 MG TOTAL) BY MOUTH DAILY. WITH FOOD   No facility-administered medications prior to visit.    Review of Systems  {Labs   Heme   Chem   Endocrine   Serology   Results Review (optional):23779}   Objective    There were no vitals taken for this visit. {Show previous vital signs (optional):23777}  Physical Exam  ***  No results found for any visits on 09/01/21.  Assessment & Plan     ***  No follow-ups on file.      {provider attestation***:1}   Myles Gip, DO  [facility] [phone] (phone) [phone] (fax)  [place]

## 2022-03-12 IMAGING — DX DG CHEST 1V PORT
1 series · 1 of 1 positions shown · non-contrast
Comparison: None.

CLINICAL DATA: Overdose of ibuprofen.

EXAM:
PORTABLE CHEST 1 VIEW

[chest ap]
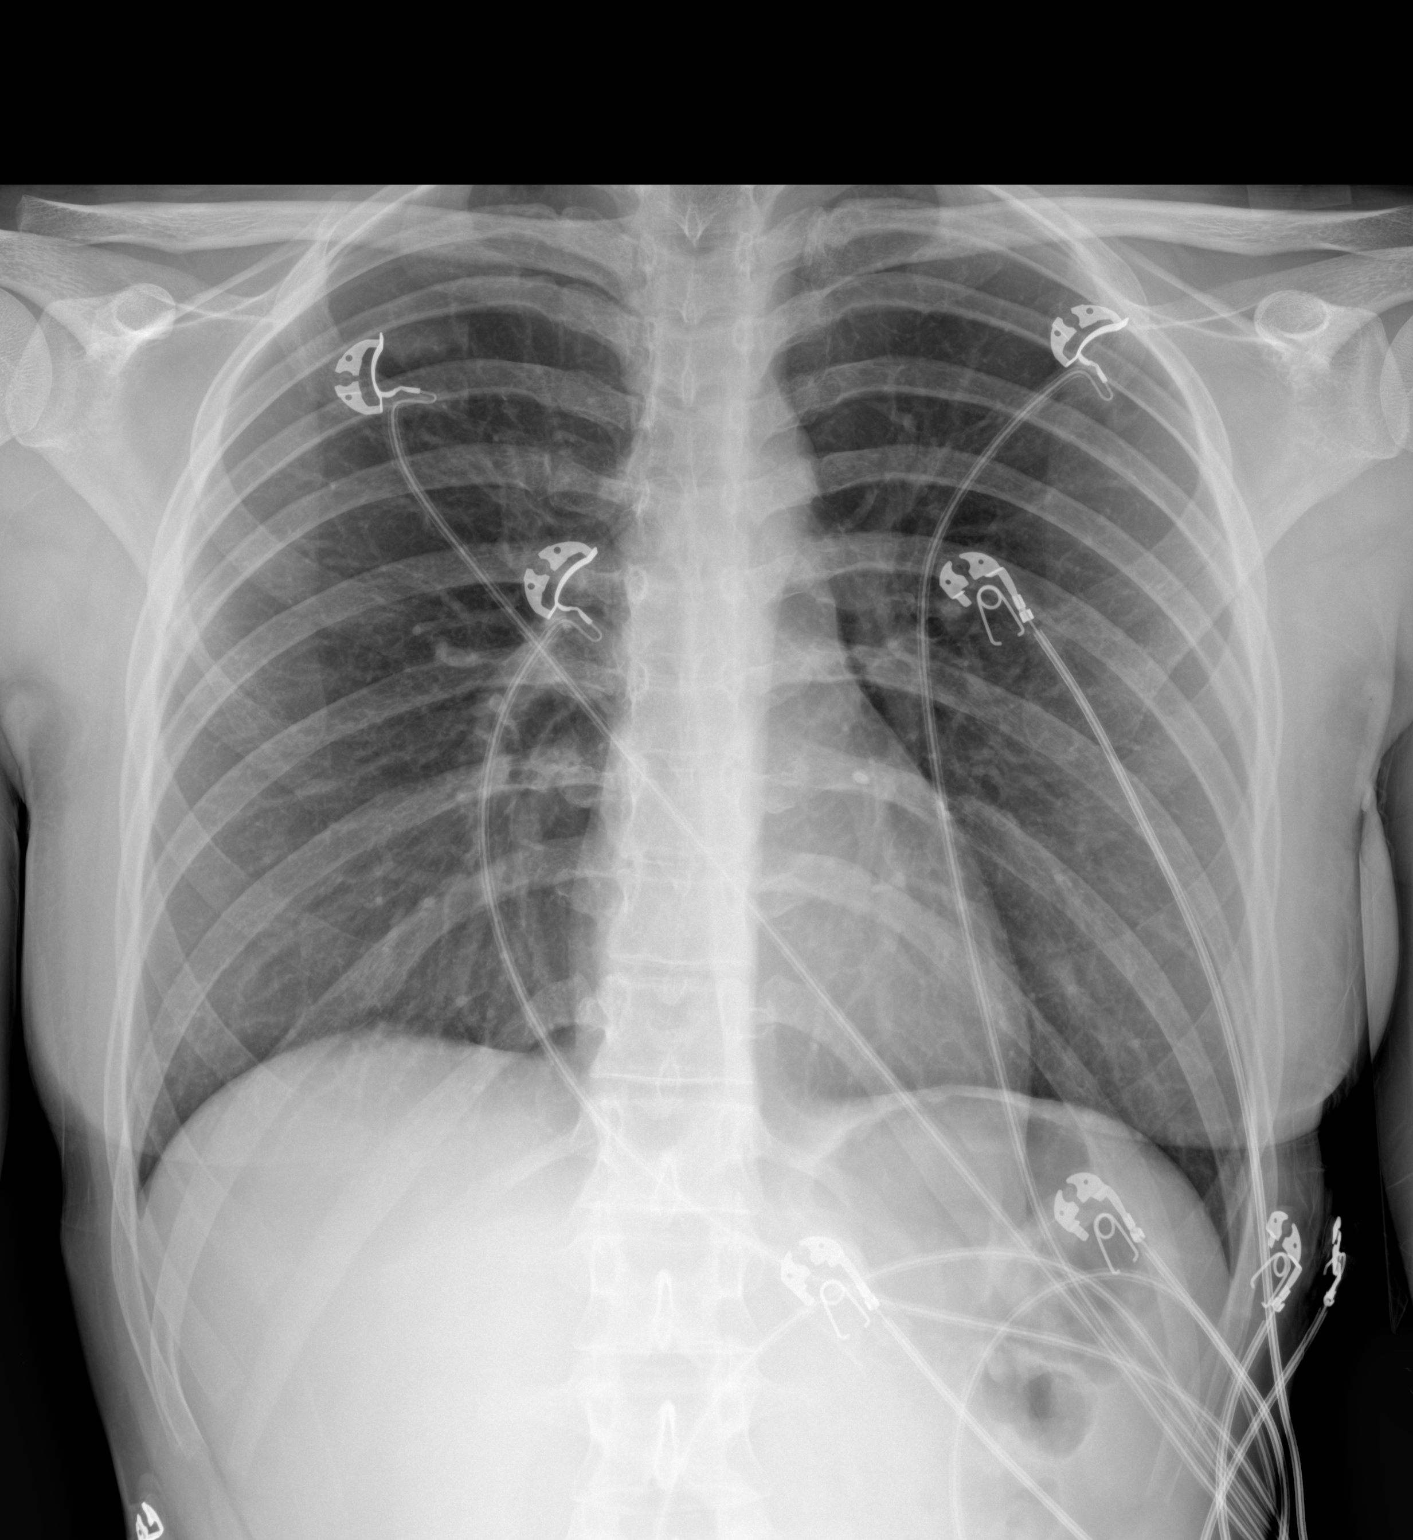

[1 of 1 positions shown; findings below may reference images not displayed]

FINDINGS: The heart size and mediastinal contours are within normal limits.
Both lungs are clear. The visualized skeletal structures are
unremarkable.
IMPRESSION: No active disease.

## 2022-09-30 ENCOUNTER — Ambulatory Visit
Admission: EM | Admit: 2022-09-30 | Discharge: 2022-09-30 | Payer: BC Managed Care – PPO | Attending: Family Medicine | Admitting: Family Medicine

## 2022-09-30 DIAGNOSIS — R000 Tachycardia, unspecified: Secondary | ICD-10-CM | POA: Diagnosis not present

## 2022-09-30 DIAGNOSIS — S069X0A Unspecified intracranial injury without loss of consciousness, initial encounter: Secondary | ICD-10-CM | POA: Diagnosis not present

## 2022-09-30 NOTE — ED Triage Notes (Signed)
Pt awake alert and GCS 15 taken to OSH by mom. Pt and mom verbalized understanding of instruction and pt was dc'd ambulating wth steady gait in NAD

## 2022-09-30 NOTE — Discharge Instructions (Addendum)
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   You have been advised to follow up immediately in the emergency department for concerning signs or symptoms as discussed during your visit. If you declined EMS transport, please have a family member take you directly to the ED at this time. Do not delay.   Based on concerns about condition, if you do not follow up in the ED, you may risk poor outcomes including worsening of condition, delayed treatment and potentially life threatening issues. If you have declined to go to the ED at this time, you should call your PCP immediately to set up a follow up appointment.   Go to ED for red flag symptoms, including; fevers you cannot reduce with Tylenol/Motrin, severe headaches, vision changes, numbness/weakness in part of the body, lethargy, confusion, intractable vomiting, severe dehydration, chest pain, breathing difficulty, severe persistent abdominal or pelvic pain, signs of severe infection (increased redness, swelling of an area), feeling faint or passing out, dizziness, etc. You should especially go to the ED for sudden acute worsening of condition if you do not elect to go at this time.

## 2022-09-30 NOTE — ED Triage Notes (Addendum)
Pt c/o HA,ringing in ears, light sensitivity,nausea & dizziness due to falling from ladder on Tuesday hanging up a light on a new patio, hit head when she fell denies any LOC.

## 2022-09-30 NOTE — ED Provider Notes (Signed)
[facility]    CSN: [id number] Arrival date & time: 09/30/22  1522      History   Chief Complaint Chief Complaint  Patient presents with   Fall   Headache   Dizziness    HPI Tracy Dominguez is a 22 y.o. female.   HPI   Tracy Dominguez presents with mom after fall on Tuesday. She was putting up a lights on the patio with her stepdad. She was up on a 24f ladder then lost her balance. She is unsure if she hit her head but landed on the patio.  Since the event, she has headache, back pain, dizziness, nauseous. Yesterday, she slept the whole day yesteraday.  She woke up with a headache.  Denies confusion but does not quite feel like herself.  She was having a hard time at work today.   Past Medical History:  Diagnosis Date   Anxiety    Depression    GERD (gastroesophageal reflux disease)     Patient Active Problem List   Diagnosis Date Noted   Generalized anxiety disorder 03/09/2021   Alcohol use disorder, moderate, dependence ([place] 03/09/2021   Major depressive disorder, recurrent severe without psychotic features ([place] 03/07/2021   Difficulty sleeping 03/25/2020   Gastroesophageal reflux disease without esophagitis 03/25/2020    History reviewed. No pertinent surgical history.  OB History   No obstetric history on file.      Home Medications    Prior to Admission medications   Medication Sig Start Date End Date Taking? Authorizing Provider  clonazePAM (KLONOPIN) 0.5 MG tablet Take 1 tablet (0.5 mg total) by mouth 2 (two) times daily as needed (anxiety). 03/11/21  Yes FSalley Scarlet MD  Dexlansoprazole 30 MG capsule Take 1 capsule (30 mg total) by mouth daily. 07/30/20  Yes BMar Daring PA-C  escitalopram (LEXAPRO) 10 MG tablet Take 1 tablet (10 mg total) by mouth daily. 03/12/21  Yes FSalley Scarlet MD  etonogestrel (NEXPLANON) 68 MG IMPL implant 1 each by Subdermal route once.   Yes [provider]  meloxicam (MOBIC) 7.5 MG tablet TAKE 1  TABLET (7.5 MG TOTAL) BY MOUTH DAILY. WITH FOOD 10/05/20  Yes BMar Daring PA-C    Family History Family History  Problem Relation Age of Onset   Depression Mother    Anxiety disorder Mother    Obesity Mother    Bipolar disorder Brother    Liver disease Maternal Grandmother    Kidney disease Maternal Grandmother    Breast cancer Maternal Grandmother    Kidney disease Paternal Grandmother    Hypertension Paternal Grandfather    Healthy Brother     Social History Social History   Tobacco Use   Smoking status: Never   Smokeless tobacco: Never  Vaping Use   Vaping Use: Every day  Substance Use Topics   Alcohol use: Yes    Comment: occ   Drug use: Never     Allergies   Patient has no known allergies.   Review of Systems Review of Systems: negative unless otherwise stated in HPI.      Physical Exam Triage Vital Signs ED Triage Vitals  Enc Vitals Group     BP 09/30/22 1536 (!) 143/96     Pulse Rate 09/30/22 1536 (!) 116     Resp 09/30/22 1536 16     Temp 09/30/22 1536 98.2 F (36.8 C)     Temp Source 09/30/22 1536 Oral     SpO2 09/30/22 1536 100 %  Weight 09/30/22 1535 129 lb (58.5 kg)     Height 09/30/22 1535 '5\' 6"'$  (1.676 m)     Head Circumference --      Peak Flow --      Pain Score 09/30/22 1534 7     Pain Loc --      Pain Edu? --      Excl. in [place]? --    No data found.  Updated Vital Signs BP (!) 143/96 (BP Location: Left Arm)   Pulse (!) 116   Temp 98.2 F (36.8 C) (Oral)   Resp 16   Ht '5\' 6"'$  (1.676 m)   Wt 58.5 kg   SpO2 100%   BMI 20.82 kg/m   Visual Acuity Right Eye Distance:   Left Eye Distance:   Bilateral Distance:    Right Eye Near:   Left Eye Near:    Bilateral Near:     Physical Exam GEN: Alert, female in no acute distress  EYES: Extraocular movements intact, pupils equal round and reactive to light HENT: Moist mucous membranes, no oropharyngeal lesions, no blood visble, no hemotympanum, no hematoma appreciated   NECK: Normal range of motion, no cervical spinous tenderness CV: regular rhythm, tachycardia RESP: no increased work of breathing, clear to ascultation bilaterally ABD: Bowel sounds present. Soft, non-tender, non-distended.  MSK: No extremity edema or deformities bilateral shoulder: Normal range of motion, no tenderness to palpation, no scapular tenderness Thoracic and lumbar spine:  midline  lumbar spinous process tenderness with paraspinal tenderness bilaterally Bilateral hip: Normal range of motion,no iliac crest tenderness, pelvis stable SKIN: warm, dry, no abrasions NEURO: alert, moves all extremities appropriately, strength 5/5 bilateral upper and lower extremities, alert and oriented, normal speech, able to walk on tiptoes and heels across the room, positive Romberg PSYCH: Normal affect, appropriate speech and behavior       UC Treatments / Results  Labs (all labs ordered are listed, but only abnormal results are displayed) Labs Reviewed - No data to display  EKG   Radiology No results found.  Procedures Procedures (including critical care time)  Medications Ordered in UC Medications - No data to display  Initial Impression / Assessment and Plan / UC Course  I have reviewed the triage vital signs and the nursing notes.  Pertinent labs & imaging results that were available during my care of the patient were reviewed by me and considered in my medical decision making (see chart for details).       Patient is a 22 y.o. female  who presents after a 32f fall that occurred on Tuesday.  She is having persistent symptoms of a concussion.  She is also tachycardic and hypertensive here.  Overall, patient is non-ill-appearing and afebrile.  History is concerning for a traumatic brain injury.  Recommended a CT of her head to evaluate for possible skull fraction and/or intracranial bleeding.  [facility]  Advised her to get information  about follow-up with a concussion clinic. [facility]  All questions asked were answered.  Discussed MDM, treatment plan and plan for follow-up with patient and her mom who agree with plan.    Final Clinical Impressions(s) / UC Diagnoses   Final diagnoses:  Traumatic brain injury, without loss of consciousness, initial encounter ([place]  Tachycardia     Discharge Instructions      You have a concussion/traumatic brain injury after falling from the ladder.   You  have been advised to follow up immediately in the emergency department for concerning signs or symptoms as discussed during your visit. If you declined EMS transport, please have a family member take you directly to the ED at this time. Do not delay.   Based on concerns about condition, if you do not follow up in the ED, you may risk poor outcomes including worsening of condition, delayed treatment and potentially life threatening issues. If you have declined to go to the ED at this time, you should call your PCP immediately to set up a follow up appointment.   Go to ED for red flag symptoms, including; fevers you cannot reduce with Tylenol/Motrin, severe headaches, vision changes, numbness/weakness in part of the body, lethargy, confusion, intractable vomiting, severe dehydration, chest pain, breathing difficulty, severe persistent abdominal or pelvic pain, signs of severe infection (increased redness, swelling of an area), feeling faint or passing out, dizziness, etc. You should especially go to the ED for sudden acute worsening of condition if you do not elect to go at this time.       ED Prescriptions   None    PDMP not reviewed this encounter.   Lyndee Hensen, DO 09/30/22 1711

## 2022-09-30 NOTE — ED Notes (Signed)
Patient is being discharged from the Urgent Care and sent to the Emergency Department via personal vehicle . Per Susa Simmonds, MD, patient is in need of higher level of care due to head injury due to 7 foot fall. Patient is aware and verbalizes understanding of plan of care.  Vitals:   09/30/22 1536  BP: (!) 143/96  Pulse: (!) 116  Resp: 16  Temp: 98.2 F (36.8 C)  SpO2: 100%
